# Patient Record
Sex: Male | Born: 1990 | Race: Black or African American | Hispanic: No | Marital: Single | Smoking: Former smoker
Health system: Southern US, Community
[De-identification: ages and names within clinical notes are randomized; demographics above are authoritative.]

---

## 2010-09-25 ENCOUNTER — Emergency Department (INDEPENDENT_AMBULATORY_CARE_PROVIDER_SITE_OTHER): Payer: Self-pay

## 2010-09-25 ENCOUNTER — Encounter: Payer: Self-pay | Admitting: Student

## 2010-09-25 ENCOUNTER — Emergency Department (HOSPITAL_BASED_OUTPATIENT_CLINIC_OR_DEPARTMENT_OTHER)
Admission: EM | Admit: 2010-09-25 | Discharge: 2010-09-25 | Disposition: A | Discharge status: Home / Self Care | Payer: Self-pay | Attending: Emergency Medicine | Admitting: Emergency Medicine

## 2010-09-25 DIAGNOSIS — R0789 Other chest pain: Secondary | ICD-10-CM | POA: Insufficient documentation

## 2010-09-25 DIAGNOSIS — J45909 Unspecified asthma, uncomplicated: Secondary | ICD-10-CM | POA: Insufficient documentation

## 2010-09-25 DIAGNOSIS — F17200 Nicotine dependence, unspecified, uncomplicated: Secondary | ICD-10-CM | POA: Insufficient documentation

## 2010-09-25 DIAGNOSIS — R079 Chest pain, unspecified: Secondary | ICD-10-CM

## 2010-09-25 MED ORDER — IBUPROFEN 800 MG PO TABS
800.0000 mg | ORAL_TABLET | Freq: Once | ORAL | Status: AC
  Administered 2010-09-25: 800 mg via ORAL
  Filled 2010-09-25: qty 1

## 2010-09-25 NOTE — ED Provider Notes (Signed)
History     Chief Complaint  Patient presents with  . Rib Injury    pt in with c/o left axiallary and rib cage pain.   The history is provided by the patient.  Patient states he was walking to store yesterday and had onset of left rib pain.  Pain increases with trunk movement.  3/10 lying still increases to10/10 with movement.  Hurts with deep inspiration.  No dyspnea, hematuria, or fever.  No similar symptoms in past.  No trauma.  Past Medical History  Diagnosis Date  . Asthma     No past surgical history on file.  No family history on file.  History  Substance Use Topics  . Smoking status: Current Some Day Smoker  . Smokeless tobacco: Not on file  . Alcohol Use: No      Review of Systems  All other systems reviewed and are negative.    Physical Exam  BP 130/75  Pulse 74  Temp(Src) 98.2 F (36.8 C) (Oral)  Resp 20  Wt 240 lb (108.863 kg)  SpO2 100%  Physical Exam  Nursing note and vitals reviewed. Constitutional: He appears well-developed and well-nourished.  HENT:  Head: Normocephalic and atraumatic.  Eyes: Pupils are equal, round, and reactive to light.  Neck: Normal range of motion. Neck supple.  Cardiovascular: Normal rate.   Pulmonary/Chest: Effort normal and breath sounds normal.  Abdominal: Soft. There is no CVA tenderness.  Musculoskeletal:       Arms:   ED Course  Procedures  MDM Dg Ribs Unilateral W/chest Left  09/25/2010  *RADIOLOGY REPORT*  Clinical Data: Left lower posterior rib pain  LEFT RIBS AND CHEST - 3+ VIEW  Comparison: None.  Findings: Lungs are clear. No pleural effusion or pneumothorax.  Cardiomediastinal silhouette is within normal limits.  Visualized osseous structures are within normal limits.  No left rib fracture is seen.  IMPRESSION: No evidence of acute cardiopulmonary disease.  No left rib fracture is seen.  Original Report Authenticated By: Charline Bills, M.D.       Hilario Quarry, MD 09/25/10 [phone]

## 2010-09-25 NOTE — ED Notes (Signed)
pt in with c/o left axillary and rib cage pain. Started last PM while ambulating to store. Reports lower back pain 3 days prior to onset after vigorous activity. Denies N V D CP LOC SOB reports pain with deep inspiration.

## 2014-06-23 ENCOUNTER — Emergency Department (HOSPITAL_BASED_OUTPATIENT_CLINIC_OR_DEPARTMENT_OTHER)
Admission: EM | Admit: 2014-06-23 | Discharge: 2014-06-23 | Disposition: A | Discharge status: Home / Self Care | Payer: Self-pay | Attending: Emergency Medicine | Admitting: Emergency Medicine

## 2014-06-23 ENCOUNTER — Encounter (HOSPITAL_BASED_OUTPATIENT_CLINIC_OR_DEPARTMENT_OTHER): Payer: Self-pay | Admitting: Emergency Medicine

## 2014-06-23 DIAGNOSIS — R5383 Other fatigue: Secondary | ICD-10-CM | POA: Insufficient documentation

## 2014-06-23 DIAGNOSIS — Z7951 Long term (current) use of inhaled steroids: Secondary | ICD-10-CM | POA: Insufficient documentation

## 2014-06-23 DIAGNOSIS — J45909 Unspecified asthma, uncomplicated: Secondary | ICD-10-CM | POA: Insufficient documentation

## 2014-06-23 DIAGNOSIS — Z720 Tobacco use: Secondary | ICD-10-CM | POA: Insufficient documentation

## 2014-06-23 LAB — BASIC METABOLIC PANEL
Anion gap: 6 (ref 5–15)
BUN: 14 mg/dL (ref 6–23)
CHLORIDE: 99 mmol/L (ref 96–112)
CO2: 30 mmol/L (ref 19–32)
Calcium: 9.5 mg/dL (ref 8.4–10.5)
Creatinine, Ser: 1.33 mg/dL (ref 0.50–1.35)
GFR calc Af Amer: 85 mL/min — ABNORMAL LOW (ref >90)
GFR calc non Af Amer: 73 mL/min — ABNORMAL LOW (ref >90)
GLUCOSE: 98 mg/dL (ref 70–99)
POTASSIUM: 3.9 mmol/L (ref 3.5–5.1)
SODIUM: 135 mmol/L (ref 135–145)

## 2014-06-23 LAB — CBC WITH DIFFERENTIAL/PLATELET
BASOS ABS: 0 10*3/uL (ref 0.0–0.1)
Basophils Relative: 1 % (ref 0–1)
Eosinophils Absolute: 0.3 10*3/uL (ref 0.0–0.7)
Eosinophils Relative: 8 % — ABNORMAL HIGH (ref 0–5)
HEMATOCRIT: 46.6 % (ref 39.0–52.0)
Hemoglobin: 16 g/dL (ref 13.0–17.0)
LYMPHS PCT: 48 % — AB (ref 12–46)
Lymphs Abs: 2.2 10*3/uL (ref 0.7–4.0)
MCH: 29.9 pg (ref 26.0–34.0)
MCHC: 34.3 g/dL (ref 30.0–36.0)
MCV: 87.1 fL (ref 78.0–100.0)
MONO ABS: 0.5 10*3/uL (ref 0.1–1.0)
Monocytes Relative: 11 % (ref 3–12)
Neutro Abs: 1.4 10*3/uL — ABNORMAL LOW (ref 1.7–7.7)
Neutrophils Relative %: 32 % — ABNORMAL LOW (ref 43–77)
Platelets: 175 10*3/uL (ref 150–400)
RBC: 5.35 MIL/uL (ref 4.22–5.81)
RDW: 12.9 % (ref 11.5–15.5)
WBC: 4.5 10*3/uL (ref 4.0–10.5)

## 2014-06-23 NOTE — ED Provider Notes (Signed)
CSN: [id number]     Arrival date & time 06/23/14  1638 History   First MD Initiated Contact with Patient 06/23/14 1647     Chief Complaint  Patient presents with  . Fatigue     (Consider location/radiation/quality/duration/timing/severity/associated sxs/prior Treatment) HPI  24 year old male with a past medical history of asthma presents with fatigue for the past 4-5 days. He states he had about 30 seconds of squeezing chest pain before this all started. Denies a shortness of breath. Since then he has felt more tired than normal. Denies any exertional chest pain or shortness of breath. No neck swelling or neck pain. No abdominal pain. No vomiting or diarrhea, as well as no black or tarry stools. No blood in his stools. Has checked his blood pressure multiple times with his mother's blood pressure cuff and noticed he has been hypertensive intermittently. At one time his blood pressure was 190 systolic, other times it has been 140 or 160. Denies any pain anywhere, just feels tired.  Past Medical History  Diagnosis Date  . Asthma    History reviewed. No pertinent past surgical history. No family history on file. History  Substance Use Topics  . Smoking status: Current Some Day Smoker  . Smokeless tobacco: Not on file  . Alcohol Use: No    Review of Systems  Constitutional: Positive for fatigue. Negative for fever.  HENT: Negative for sore throat.   Respiratory: Negative for cough and shortness of breath.   Cardiovascular: Positive for chest pain (4 days ago, none since). Negative for leg swelling.  Gastrointestinal: Negative for nausea, vomiting, abdominal pain and blood in stool.  Genitourinary: Negative for dysuria.  Neurological: Negative for dizziness, weakness and headaches.  All other systems reviewed and are negative.     Allergies  Review of patient's allergies indicates no known allergies.  Home Medications   Prior to Admission medications   Medication Sig Start  Date End Date Taking? Authorizing Provider  fluticasone-salmeterol (ADVAIR HFA) 115-21 MCG/ACT inhaler Inhale 2 puffs into the lungs 2 (two) times daily.      Historical Provider, MD   BP 135/88 mmHg  Pulse 69  Temp(Src) 98.4 F (36.9 C) (Oral)  Resp 16  Ht 5\' 11"  (1.803 m)  Wt 230 lb (104.327 kg)  BMI 32.09 kg/m2  SpO2 100% Physical Exam  Constitutional: He is oriented to person, place, and time. He appears well-developed and well-nourished.  HENT:  Head: Normocephalic and atraumatic.  Right Ear: External ear normal.  Left Ear: External ear normal.  Nose: Nose normal.  Eyes: Right eye exhibits no discharge. Left eye exhibits no discharge.  Neck: Neck supple. No thyromegaly present.  Cardiovascular: Normal rate, regular rhythm, normal heart sounds and intact distal pulses.   No murmur heard. Pulmonary/Chest: Effort normal and breath sounds normal.  Abdominal: Soft. There is no tenderness.  Musculoskeletal: He exhibits no edema.  Neurological: He is alert and oriented to person, place, and time.  Skin: Skin is warm and dry.  Nursing note and vitals reviewed.   ED Course  Procedures (including critical care time) Labs Review Labs Reviewed  CBC WITH DIFFERENTIAL/PLATELET - Abnormal; Notable for the following:    Neutrophils Relative % 32 (*)    Neutro Abs 1.4 (*)    Lymphocytes Relative 48 (*)    Eosinophils Relative 8 (*)    All other components within normal limits  BASIC METABOLIC PANEL - Abnormal; Notable for the following:    GFR calc non Af Denyse DagoAmer  73 (*)    GFR calc Af Amer 85 (*)    All other components within normal limits    Imaging Review No results found.   EKG Interpretation   Date/Time:  Thursday June 23 2014 17:22:05 EDT Ventricular Rate:  64 PR Interval:  206 QRS Duration: 96 QT Interval:  374 QTC Calculation: 385 R Axis:   94 Text Interpretation:  Normal sinus rhythm Rightward axis Borderline ECG No  old tracing to compare Confirmed by Ayva Veilleux   MD, Keary Waterson ([id number]) on  06/23/2014 5:34:20 PM      MDM   Final diagnoses:  Other fatigue    Patient with nonspecific fatigue for the past 4 days. Did have one episode of chest pain is very brief that sounds atypical. He is low risk for ACS and has a benign ECG. Symptoms since then and not feel troponin testing is necessary. No anemia or other acute abdominal is on lab work. Normal exam. Stable for discharge, will recommend PCP follow-up. No hypertension here.    Pricilla Loveless, MD 06/23/14 [phone]

## 2014-06-23 NOTE — ED Notes (Signed)
Fatigue x4 days.  Had one instance of cp 4 days ago that lasted 30 seconds.  Denies any other sx.

## 2014-06-23 NOTE — Discharge Instructions (Signed)

## 2014-06-23 NOTE — ED Notes (Signed)
MD at bedside. 

## 2014-12-20 ENCOUNTER — Encounter (HOSPITAL_BASED_OUTPATIENT_CLINIC_OR_DEPARTMENT_OTHER): Payer: Self-pay | Admitting: *Deleted

## 2014-12-20 ENCOUNTER — Emergency Department (HOSPITAL_BASED_OUTPATIENT_CLINIC_OR_DEPARTMENT_OTHER): Payer: Self-pay

## 2014-12-20 ENCOUNTER — Emergency Department (HOSPITAL_BASED_OUTPATIENT_CLINIC_OR_DEPARTMENT_OTHER)
Admission: EM | Admit: 2014-12-20 | Discharge: 2014-12-20 | Disposition: A | Discharge status: Home / Self Care | Payer: Self-pay | Attending: Emergency Medicine | Admitting: Emergency Medicine

## 2014-12-20 DIAGNOSIS — J45909 Unspecified asthma, uncomplicated: Secondary | ICD-10-CM | POA: Insufficient documentation

## 2014-12-20 DIAGNOSIS — Z87891 Personal history of nicotine dependence: Secondary | ICD-10-CM | POA: Insufficient documentation

## 2014-12-20 DIAGNOSIS — J159 Unspecified bacterial pneumonia: Secondary | ICD-10-CM | POA: Insufficient documentation

## 2014-12-20 DIAGNOSIS — J189 Pneumonia, unspecified organism: Secondary | ICD-10-CM

## 2014-12-20 DIAGNOSIS — R42 Dizziness and giddiness: Secondary | ICD-10-CM | POA: Insufficient documentation

## 2014-12-20 DIAGNOSIS — Z7951 Long term (current) use of inhaled steroids: Secondary | ICD-10-CM | POA: Insufficient documentation

## 2014-12-20 DIAGNOSIS — R531 Weakness: Secondary | ICD-10-CM | POA: Insufficient documentation

## 2014-12-20 LAB — BASIC METABOLIC PANEL
ANION GAP: 8 (ref 5–15)
BUN: 10 mg/dL (ref 6–20)
CO2: 28 mmol/L (ref 22–32)
Calcium: 9.2 mg/dL (ref 8.9–10.3)
Chloride: 98 mmol/L — ABNORMAL LOW (ref 101–111)
Creatinine, Ser: 1.35 mg/dL — ABNORMAL HIGH (ref 0.61–1.24)
GFR calc Af Amer: >60 mL/min (ref >60)
Glucose, Bld: 130 mg/dL — ABNORMAL HIGH (ref 65–99)
POTASSIUM: 3.8 mmol/L (ref 3.5–5.1)
SODIUM: 134 mmol/L — AB (ref 135–145)

## 2014-12-20 LAB — CBC WITH DIFFERENTIAL/PLATELET
BASOS ABS: 0 10*3/uL (ref 0.0–0.1)
Basophils Relative: 1 %
EOS ABS: 0 10*3/uL (ref 0.0–0.7)
EOS PCT: 0 %
HCT: 48.6 % (ref 39.0–52.0)
Hemoglobin: 16.5 g/dL (ref 13.0–17.0)
Lymphocytes Relative: 17 %
Lymphs Abs: 1.4 10*3/uL (ref 0.7–4.0)
MCH: 29.4 pg (ref 26.0–34.0)
MCHC: 34 g/dL (ref 30.0–36.0)
MCV: 86.6 fL (ref 78.0–100.0)
Monocytes Absolute: 1 10*3/uL (ref 0.1–1.0)
Monocytes Relative: 12 %
Neutro Abs: 5.9 10*3/uL (ref 1.7–7.7)
Neutrophils Relative %: 70 %
PLATELETS: 179 10*3/uL (ref 150–400)
RBC: 5.61 MIL/uL (ref 4.22–5.81)
RDW: 13.2 % (ref 11.5–15.5)
WBC: 8.4 10*3/uL (ref 4.0–10.5)

## 2014-12-20 LAB — URINALYSIS, ROUTINE W REFLEX MICROSCOPIC
BILIRUBIN URINE: NEGATIVE
Glucose, UA: NEGATIVE mg/dL
HGB URINE DIPSTICK: NEGATIVE
Ketones, ur: NEGATIVE mg/dL
Leukocytes, UA: NEGATIVE
Nitrite: NEGATIVE
PROTEIN: NEGATIVE mg/dL
Specific Gravity, Urine: 1.017 (ref 1.005–1.030)
pH: 7.5 (ref 5.0–8.0)

## 2014-12-20 MED ORDER — GUAIFENESIN ER 1200 MG PO TB12: 1.0000 | ORAL_TABLET | Freq: Two times a day (BID) | ORAL | Status: AC

## 2014-12-20 MED ORDER — AZITHROMYCIN 250 MG PO TABS: ORAL_TABLET | ORAL | Status: AC

## 2014-12-20 MED ORDER — ACETAMINOPHEN 500 MG PO TABS
1000.0000 mg | ORAL_TABLET | Freq: Once | ORAL | Status: AC
  Administered 2014-12-20: 1000 mg via ORAL
  Filled 2014-12-20: qty 2

## 2014-12-20 MED ORDER — CEFTRIAXONE SODIUM 1 G IJ SOLR
INTRAMUSCULAR | Status: AC
  Filled 2014-12-20: qty 10

## 2014-12-20 MED ORDER — SODIUM CHLORIDE 0.9 % IV BOLUS (SEPSIS)
1000.0000 mL | Freq: Once | INTRAVENOUS | Status: AC
  Administered 2014-12-20: 1000 mL via INTRAVENOUS

## 2014-12-20 MED ORDER — AEROCHAMBER PLUS FLO-VU MEDIUM MISC
1.0000 | Freq: Once | Status: AC
  Administered 2014-12-20: 1
  Filled 2014-12-20: qty 1

## 2014-12-20 MED ORDER — DEXTROSE 5 % IV SOLN
1.0000 g | Freq: Once | INTRAVENOUS | Status: AC
  Administered 2014-12-20: 1 g via INTRAVENOUS

## 2014-12-20 MED ORDER — IPRATROPIUM-ALBUTEROL 0.5-2.5 (3) MG/3ML IN SOLN
3.0000 mL | Freq: Once | RESPIRATORY_TRACT | Status: DC
  Filled 2014-12-20: qty 3

## 2014-12-20 MED ORDER — ALBUTEROL SULFATE HFA 108 (90 BASE) MCG/ACT IN AERS
2.0000 | INHALATION_SPRAY | RESPIRATORY_TRACT | Status: DC | PRN
  Administered 2014-12-20: 2 via RESPIRATORY_TRACT
  Filled 2014-12-20: qty 6.7

## 2014-12-20 NOTE — ED Provider Notes (Signed)
CSN: [id number]     Arrival date & time 12/20/14  1631 History   First MD Initiated Contact with Patient 12/20/14 1720     Chief Complaint  Patient presents with  . Fever     (Consider location/radiation/quality/duration/timing/severity/associated sxs/prior Treatment) HPI Patient presents to the emergency department with fever that started yesterday.  The patient states that he has had some dizziness and weakness since the fever started, but no other symptoms.  He denies cough, runny nose, sore throat, headache, blurred vision, back pain, dysuria, abdominal pain, chest pain, shortness of breath, near syncope or syncope.  The patient states that he did not take any medications prior to arrival.  States the disease makes condition better or worse Past Medical History  Diagnosis Date  . Asthma    History reviewed. No pertinent past surgical history. No family history on file. Social History  Substance Use Topics  . Smoking status: Former Games developer  . Smokeless tobacco: None  . Alcohol Use: No    Review of Systems All other systems negative except as documented in the HPI. All pertinent positives and negatives as reviewed in the HPI.   Allergies  Review of patient's allergies indicates no known allergies.  Home Medications   Prior to Admission medications   Medication Sig Start Date End Date Taking? Authorizing Provider  fluticasone-salmeterol (ADVAIR HFA) 115-21 MCG/ACT inhaler Inhale 2 puffs into the lungs 2 (two) times daily.      Historical Provider, MD   BP 136/77 mmHg  Pulse 99  Temp(Src) 98.8 F (37.1 C) (Oral)  Resp 20  Ht  (1.803 m)  Wt 220 lb (99.791 kg)  BMI 30.70 kg/m2  SpO2 97% Physical Exam  Constitutional: He is oriented to person, place, and time. He appears well-developed and well-nourished. No distress.  HENT:  Head: Normocephalic and atraumatic.  Mouth/Throat: Oropharynx is clear and moist.  Eyes: Pupils are equal, round, and reactive to light.   Neck: Normal range of motion. Neck supple.  Cardiovascular: Normal rate, regular rhythm and normal heart sounds.  Exam reveals no gallop and no friction rub.   No murmur heard. Pulmonary/Chest: Effort normal and breath sounds normal. No respiratory distress.  Neurological: He is alert and oriented to person, place, and time. He exhibits normal muscle tone. Coordination normal.  Skin: Skin is warm and dry. No rash noted. No erythema.  Nursing note and vitals reviewed.   ED Course  Procedures (including critical care time) Labs Review Labs Reviewed  URINALYSIS, ROUTINE W REFLEX MICROSCOPIC (NOT AT [facility]) - Abnormal; Notable for the following:    Urobilinogen, UA >8.0 (*)    All other components within normal limits  BASIC METABOLIC PANEL - Abnormal; Notable for the following:    Sodium 134 (*)    Chloride 98 (*)    Glucose, Bld 130 (*)    Creatinine, Ser 1.35 (*)    All other components within normal limits  CBC WITH DIFFERENTIAL/PLATELET    Imaging Review Dg Chest 2 View  12/20/2014   CLINICAL DATA:  Fever and dizziness for 1 day  EXAM: CHEST - 2 VIEW  COMPARISON:  09/25/2010  FINDINGS: Cardiac shadow is within normal limits. The medial aspect of the right lung base there is evidence of right middle lobe pneumonia. No associated effusion is seen. No other infiltrates are noted. No bony abnormality is seen.  IMPRESSION: Right middle lobe pneumonia.   Electronically Signed   By: Alcide Clever M.D.   On: 12/20/2014  18:34   I have personally reviewed and evaluated these images and lab results as part of my medical decision-making.  Patient be treated for community-acquired pneumonia based on his chest x-ray.  Patient is advised to rest as much as possible.  Increase his fluid intake.  Told to return here for any worsening in his condition   Charlestine Night, PA-C 12/20/14 2025  Geoffery Lyons, MD 12/20/14 2138

## 2014-12-20 NOTE — ED Notes (Signed)
Pt discharged at 2040, was not taken out of computer

## 2014-12-20 NOTE — Discharge Instructions (Signed)
Return here as needed for any worsening in your condition increase her fluid intake and rest as much as possible

## 2014-12-20 NOTE — ED Notes (Signed)
Fever, chills and headache. Last Tylenol was at 7am.

## 2014-12-20 NOTE — Progress Notes (Signed)
Patient refused Duoneb nebulizer treatment.  Patient states that he is breathing fine.

## 2015-01-16 ENCOUNTER — Telehealth (HOSPITAL_COMMUNITY): Payer: Self-pay

## 2015-01-16 NOTE — Telephone Encounter (Signed)
Pt thought he had STD testing done.  ID verified x 2.  Pt informed no STD testing done.

## 2015-02-04 ENCOUNTER — Emergency Department (HOSPITAL_BASED_OUTPATIENT_CLINIC_OR_DEPARTMENT_OTHER)
Admission: EM | Admit: 2015-02-04 | Discharge: 2015-02-05 | Disposition: A | Discharge status: Home / Self Care | Payer: Self-pay | Attending: Emergency Medicine | Admitting: Emergency Medicine

## 2015-02-04 DIAGNOSIS — J45909 Unspecified asthma, uncomplicated: Secondary | ICD-10-CM | POA: Insufficient documentation

## 2015-02-04 DIAGNOSIS — J020 Streptococcal pharyngitis: Secondary | ICD-10-CM | POA: Insufficient documentation

## 2015-02-04 DIAGNOSIS — Z7951 Long term (current) use of inhaled steroids: Secondary | ICD-10-CM | POA: Insufficient documentation

## 2015-02-04 DIAGNOSIS — Z87891 Personal history of nicotine dependence: Secondary | ICD-10-CM | POA: Insufficient documentation

## 2015-02-04 DIAGNOSIS — Z79899 Other long term (current) drug therapy: Secondary | ICD-10-CM | POA: Insufficient documentation

## 2015-02-04 LAB — RAPID STREP SCREEN (MED CTR MEBANE ONLY): STREPTOCOCCUS, GROUP A SCREEN (DIRECT): POSITIVE — AB

## 2015-02-04 MED ORDER — ACETAMINOPHEN 325 MG PO TABS
650.0000 mg | ORAL_TABLET | Freq: Once | ORAL | Status: AC
  Administered 2015-02-04: 650 mg via ORAL
  Filled 2015-02-04: qty 2

## 2015-02-04 NOTE — ED Notes (Signed)
Presents with 3 days of sore throat, fever of 103 and pain with swallowing and talking. Throat red.

## 2015-02-05 MED ORDER — DEXAMETHASONE SODIUM PHOSPHATE 10 MG/ML IJ SOLN
10.0000 mg | Freq: Once | INTRAMUSCULAR | Status: AC
  Administered 2015-02-05: 10 mg via INTRAMUSCULAR
  Filled 2015-02-05: qty 1

## 2015-02-05 MED ORDER — IBUPROFEN 400 MG PO TABS
400.0000 mg | ORAL_TABLET | Freq: Once | ORAL | Status: AC
  Administered 2015-02-05: 400 mg via ORAL
  Filled 2015-02-05: qty 1

## 2015-02-05 MED ORDER — HYDROCODONE-ACETAMINOPHEN 7.5-325 MG/15ML PO SOLN: ORAL | Status: AC

## 2015-02-05 MED ORDER — PENICILLIN G BENZATHINE 1200000 UNIT/2ML IM SUSP
1.2000 10*6.[IU] | Freq: Once | INTRAMUSCULAR | Status: AC
  Administered 2015-02-05: 1.2 10*6.[IU] via INTRAMUSCULAR
  Filled 2015-02-05: qty 2

## 2015-02-05 MED ORDER — OXYCODONE-ACETAMINOPHEN 5-325 MG PO TABS
1.0000 | ORAL_TABLET | Freq: Once | ORAL | Status: AC
  Administered 2015-02-05: 1 via ORAL
  Filled 2015-02-05: qty 1

## 2015-02-05 NOTE — ED Provider Notes (Signed)
CSN: [id number]     Arrival date & time 02/04/15  2324 History   First MD Initiated Contact with Patient 02/05/15 0109     Chief Complaint  Patient presents with  . Sore Throat     (Consider location/radiation/quality/duration/timing/severity/associated sxs/prior Treatment) HPI  This is a 24 year old male with a three-day history of sore throat and fevers high as 103.5. He rates his throat pain as moderate to severe, worse with swallowing. He has had general malaise but no shortness of breath, dysphonia, rash or abdominal pain. He was given ibuprofen and Percocet earlier with some relief.  Past Medical History  Diagnosis Date  . Asthma    No past surgical history on file. No family history on file. Social History  Substance Use Topics  . Smoking status: Former Games developermoker  . Smokeless tobacco: Not on file  . Alcohol Use: No    Review of Systems  All other systems reviewed and are negative.   Allergies  Review of patient's allergies indicates no known allergies.  Home Medications   Prior to Admission medications   Medication Sig Start Date End Date Taking? Authorizing Provider  azithromycin (ZITHROMAX) 250 MG tablet 2  By mouth day one, then 1 by mouth daily days 2 through 5 12/20/14   Charlestine Nighthristopher Lawyer, PA-C  fluticasone-salmeterol (ADVAIR HFA) 115-21 MCG/ACT inhaler Inhale 2 puffs into the lungs 2 (two) times daily.      Historical Provider, MD  Guaifenesin 1200 MG TB12 Take 1 tablet (1,200 mg total) by mouth 2 (two) times daily. 12/20/14   Christopher Lawyer, PA-C   BP 126/83 mmHg  Pulse 115  Temp(Src) 101.6 F (38.7 C) (Oral)  Resp 16  Ht 5\' 11"  (1.803 m)  Wt 220 lb (99.791 kg)  BMI 30.70 kg/m2  SpO2 96%   Physical Exam  General: Well-developed, well-nourished male in no acute distress; appearance consistent with age of record HENT: normocephalic; atraumatic; pharyngeal erythema without exudate Eyes: pupils equal, round and reactive to light; extraocular muscles  intact Neck: supple; no lymphadenopathy Heart: regular rate and rhythm Lungs: clear to auscultation bilaterally Abdomen: soft; nondistended; nontender; no masses or hepatosplenomegaly; bowel sounds present Extremities: No deformity; full range of motion; pulses normal Neurologic: Awake, alert and oriented; motor function intact in all extremities and symmetric; no facial droop Skin: Warm and dry Psychiatric: Flat affect    ED Course  Procedures (including critical care time)   MDM   Nursing notes and vitals signs, including pulse oximetry, reviewed.  Summary of this visit's results, reviewed by myself:  Labs:  Results for orders placed or performed during the hospital encounter of 02/04/15 (from the past 24 hour(s))  Rapid strep screen     Status: Abnormal   Collection Time: 02/04/15 11:30 PM  Result Value Ref Range   Streptococcus, Group A Screen (Direct) POSITIVE (A) NEGATIVE       Paula LibraJohn Caasi Giglia, MD 02/05/15 [id number]

## 2015-03-14 ENCOUNTER — Emergency Department (HOSPITAL_BASED_OUTPATIENT_CLINIC_OR_DEPARTMENT_OTHER)
Admission: EM | Admit: 2015-03-14 | Discharge: 2015-03-14 | Disposition: A | Discharge status: Home / Self Care | Payer: Self-pay | Attending: Emergency Medicine | Admitting: Emergency Medicine

## 2015-03-14 ENCOUNTER — Emergency Department (HOSPITAL_BASED_OUTPATIENT_CLINIC_OR_DEPARTMENT_OTHER): Payer: Self-pay

## 2015-03-14 ENCOUNTER — Encounter (HOSPITAL_BASED_OUTPATIENT_CLINIC_OR_DEPARTMENT_OTHER): Payer: Self-pay | Admitting: *Deleted

## 2015-03-14 DIAGNOSIS — Y9289 Other specified places as the place of occurrence of the external cause: Secondary | ICD-10-CM | POA: Insufficient documentation

## 2015-03-14 DIAGNOSIS — Z7951 Long term (current) use of inhaled steroids: Secondary | ICD-10-CM | POA: Insufficient documentation

## 2015-03-14 DIAGNOSIS — G8929 Other chronic pain: Secondary | ICD-10-CM | POA: Insufficient documentation

## 2015-03-14 DIAGNOSIS — M79662 Pain in left lower leg: Secondary | ICD-10-CM

## 2015-03-14 DIAGNOSIS — J45909 Unspecified asthma, uncomplicated: Secondary | ICD-10-CM | POA: Insufficient documentation

## 2015-03-14 DIAGNOSIS — Y9389 Activity, other specified: Secondary | ICD-10-CM | POA: Insufficient documentation

## 2015-03-14 DIAGNOSIS — S8992XA Unspecified injury of left lower leg, initial encounter: Secondary | ICD-10-CM | POA: Insufficient documentation

## 2015-03-14 DIAGNOSIS — Y998 Other external cause status: Secondary | ICD-10-CM | POA: Insufficient documentation

## 2015-03-14 DIAGNOSIS — W1839XA Other fall on same level, initial encounter: Secondary | ICD-10-CM | POA: Insufficient documentation

## 2015-03-14 DIAGNOSIS — Z79899 Other long term (current) drug therapy: Secondary | ICD-10-CM | POA: Insufficient documentation

## 2015-03-14 DIAGNOSIS — Z87891 Personal history of nicotine dependence: Secondary | ICD-10-CM | POA: Insufficient documentation

## 2015-03-14 MED ORDER — NAPROXEN 500 MG PO TABS: 500.0000 mg | ORAL_TABLET | Freq: Two times a day (BID) | ORAL | Status: AC

## 2015-03-14 NOTE — Discharge Instructions (Signed)
Possible Patellar Tendon Tear or Disruption With Rehab A patellar tendon tear or disruption is a complete tear of the tendon below the kneecap. The patellar tendon attaches the thigh muscle (quadriceps) to the shinbone (tibia). These muscles are responsible for bending the knee and flexing the hip. A tear in the patellar tendon results in a disability to perform these actions. SYMPTOMS   A "pop" or tear felt in the knee or under the kneecap, at the time of injury.  Pain, tenderness, swelling, warmth, or redness over and around the patellar tendon.  Pain that gets worse when trying to forcefully straighten the knee or bend the knee.  Inability to straighten the knee when seated.  Crackling sound (crepitation) when the tendon is moved or touched.  Bruising (contusion) around the knee within 48 hours of injury.  Loss of firm fullness when pushing on the area where the tendon ruptured (a defect between the ends of the tendon where they separated from each other). CAUSES  The patellar tendon tears when a force is placed on it that is greater than it can handle. Common causes of injury include:  A stressful incident, such as with jumping, hurdling, or starting a sprint.  Direct hit (trauma) to the knee. RISK INCREASES WITH:  Sports that require sudden, explosive muscle contraction, such as those involving jumping or quick starts.  Running or contact sports.  Poor strength and flexibility.  Previous patellar tendon injury.  Untreated patellar tendinitis.  Corticosteroid injection into the patellar tendon. (Corticosteroid injections weaken tendons.) PREVENTION  Warm up and stretch properly before activity.  Allow for adequate recovery between workouts.  Maintain physical fitness:  Strength, flexibility, and endurance.  Cardiovascular fitness.  Protect the knee with taping, protective strapping, or elastic compression bandage during activity. PROGNOSIS  If treated properly,  patellar tendon tears usually heal, with a return to sports within 6 to 9 months after injury.  RELATED COMPLICATIONS   Weakness of the thigh (quadriceps) muscles, especially if the tear is left untreated.  Re-rupture of the tendon after treatment.  Prolonged disability.  Risks of surgery: infection, injury to nerves (numbness, weakness, or paralysis), bleeding, knee stiffness, knee weakness, pain when sitting for long periods, pain when getting up from a seated position and when kneeling or squatting, pain going up or down stairs or hills, and knee giving way or buckling. TREATMENT  Treatment first involves resting from any activities that aggravate the symptoms. The use of ice and medicine will help reduce pain and inflammation. Applying a compression bandage and elevating the knee above the level of the heart will also help reduce inflammation. Definitive treatment for patellar tendon tears is surgery, because contraction of the quadriceps tendon prevents healing of the tendon. Surgery often involves using stitches (sutures) to sew the ends of the tendon back together. Surgery is followed by restraint of the knee, to allow for healing. After restraint, it is important to perform strengthening and stretching exercises to help regain strength and a full range of motion. These exercises may be completed at home or with a therapist.  MEDICATION   If pain medicine is needed, nonsteroidal anti-inflammatory medicines (aspirin and ibuprofen), or other minor pain relievers (acetaminophen), are often advised.  Do not take pain medicine for 7 days before surgery.  Prescription pain relievers may be given, if your caregiver thinks they are needed. Use only as directed and only as much as you need. COLD THERAPY  Cold treatment (icing) should be applied for 10 to  15 minutes every 2 to 3 hours for inflammation and pain, and immediately after activity that aggravates your symptoms. Use ice packs or an ice  massage. SEEK MEDICAL CARE IF:  Pain increases, despite treatment.  Cast discomfort develops.  Any of the following occur after surgery: signs of infection, including fever, increased pain, swelling, redness, drainage of fluids, or bleeding in the affected area.  New, unexplained symptoms develop. (Drugs used in treatment may produce side effects.) EXERCISES RANGE OF MOTION (ROM) AND STRETCHING EXERCISES - Patellar Tendon Tear/Disruption These exercises may help you when beginning to rehabilitate your injury. Your symptoms may resolve with or without further involvement from your physician, physical therapist or athletic trainer. While completing these exercises, remember:   Restoring tissue flexibility helps normal motion to return to the joints. This allows healthier, less painful movement and activity.  An effective stretch should be held for at least 30 seconds.  A stretch should never be painful. You should only feel a gentle lengthening or release in the stretched tissue. RANGE OF MOTION - Knee Flexion and Extension, Active-Assisted  Sit on the edge of a table or chair with your thighs firmly supported. It may be helpful to place a folded towel under the end of your right / left thigh.  Flexion (bending): Place the ankle of your healthy leg on top of the other ankle. Use your healthy leg to gently bend your right / left knee until you feel a mild tension across the top of your knee.  Hold for __________ seconds.  Extension (straightening): Switch your ankles so your right / left leg is on top. Use your healthy leg to straighten your right / left knee until you feel a mild tension on the backside of your knee.  Hold for __________ seconds. Repeat __________ times. Complete this exercise __________ times per day. RANGE OF MOTION - Knee Flexion, Active  Lie on your back with both knees straight. (If this causes back discomfort, bend your healthy knee, placing your foot flat on the  floor.)  Slowly slide your heel back toward your buttocks until you feel a gentle stretch in the front of your knee or thigh.  Hold for __________ seconds. Slowly slide your heel back to the starting position. Repeat __________ times. Complete this exercise __________ times per day.  STRENGTHENING EXERCISES - Patellar Tendon Tear/Disruption  These exercises may help you when beginning to rehabilitate your injury. They may resolve your symptoms with or without further involvement from your physician, physical therapist or athletic trainer. While completing these exercises, remember:   Muscles can gain both the endurance and the strength needed for everyday activities through controlled exercises.  Complete these exercises as instructed by your physician, physical therapist or athletic trainer. Increase the resistance and repetitions only as guided by your caregiver. STRENGTH - Quadriceps, Isometrics  Lie on your back with your right / left leg extended and your opposite knee bent.  Gradually tense the muscles in the front of your right / left thigh. You should see either your kneecap slide up toward your hip or increased dimpling just above the knee. This motion will push the back of the knee down toward the floor, mat, or bed on which you are lying.  Hold the muscle as tight as you can without increasing your pain for __________ seconds.  Relax the muscles slowly and completely between each repetition. Repeat __________ times. Complete this exercise __________ times per day.  STRENGTH - Quadriceps, Straight Leg Raises Quality  counts! Watch for signs that the quadriceps muscle is working, to be sure you are strengthening the correct muscles and not "cheating" by substituting with healthier muscles.  Lay on your back with your right / left leg extended and your opposite knee bent.  Tense the muscles in the front of your right / left thigh. You should see either your kneecap slide up or  increased dimpling just above the knee. Your thigh may even shake a bit.  Tighten these muscles even more and raise your leg 4 to 6 inches off the floor. Hold for __________ seconds.  Keeping these muscles tense, lower your leg.  Relax the muscles slowly and completely between each repetition. Repeat __________ times. Complete this exercise __________ times per day.  STRENGTH - Hip Abductors, Straight Leg Raises  Be aware of your form throughout the entire exercise, so that you exercise the correct muscles Poor form means that you are not strengthening the correct muscles.  Lie on your side so that your head, shoulders, knee and hip line up. You may bend your lower knee to help maintain your balance. Your right / left leg should be on top.  Roll your hips slightly forward, so that your hips are stacked directly over each other and your right / left knee is facing forward.  Lift your top leg up 4-6 inches, leading with your heel. Be sure that your foot does not drift forward or that your knee does not roll toward the ceiling.  Hold this position for __________ seconds. You should feel the muscles in your outer hip lifting. (You may not notice this until your leg begins to tire.)  Slowly lower your leg to the starting position. Allow the muscles to fully relax before beginning the next repetition. Repeat __________ times. Complete this exercise __________ times per day.  STRENGTH - Hip Extensors, Straight Leg Raises  Lie on your stomach on a firm surface.  Tense the muscles in your buttocks to lift your right / left leg about 4 inches. If you cannot lift your leg this high without arching your back, place a pillow under your hips.  Keep your knee straight. Hold __________ seconds.  Slowly lower your leg to the starting position and allow it to relax completely before starting the next repetition. Repeat __________ times. Complete this exercise __________ times per day.  STRENGTH - Hip  Adductors, Straight Leg Raises  Lie on your side so that your head, shoulders, knee and hip line up. You may place your upper foot in front, to help maintain your balance. Your right / left leg should be on the bottom.  Roll your hips slightly forward, so that your hips are stacked directly over each other and your right / left knee is facing forward.  Tense the muscles in your inner thigh and lift your bottom leg 4-6 inches. Hold this position for __________ seconds.  Slowly lower your leg to the starting position. Allow the muscles to fully relax before beginning the next repetition. Repeat __________ times. Complete this exercise __________ times per day.   This information is not intended to replace advice given to you by your health care provider. Make sure you discuss any questions you have with your health care provider.   Document Released: 02/25/2005 Document Revised: 07/12/2014 Document Reviewed: 06/09/2008 Elsevier Interactive Patient Education [employer] Inc[employer]

## 2015-03-14 NOTE — ED Provider Notes (Signed)
CSN: [id number]     Arrival date & time 03/14/15  1335 History   By signing my name below, I, Arlan Organ, attest that this documentation has been prepared under the direction and in the presence of Arby Barrette, MD.  Electronically Signed: Arlan Organ, ED Scribe. 03/14/2015. 6:19 PM.   Chief Complaint  Patient presents with  . Leg Injury   The history is provided by the patient. No language interpreter was used.    HPI Comments: Sean Leon is a 25 y.o. male without any pertinent past medical history who presents to the Emergency Department here for a L lower leg injury sustained 1 months ago. Pt states he fell on his knee a few weeks ago and noted a scar afterwards. Pt now c/o constant, ongoing pain and associated swelling to the L lower leg. No new injury to leg. Discomfort is exacerbated with ambulation. No alleviating factors at this time. No OTC medications or home remedies attempted prior to arrival. No recent fever, chills, nausea, or vomiting. No weakness, loss of sensation, or numbness. No known allergies to medications.  PCP: No PCP Per Patient    Past Medical History  Diagnosis Date  . Asthma    History reviewed. No pertinent past surgical history. No family history on file. Social History  Substance Use Topics  . Smoking status: Former Games developer  . Smokeless tobacco: None  . Alcohol Use: No    Review of Systems   A complete 10 system review of systems was obtained and all systems are negative except as noted in the HPI and PMH.     Allergies  Review of patient's allergies indicates no known allergies.  Home Medications   Prior to Admission medications   Medication Sig Start Date End Date Taking? Authorizing Provider  azithromycin (ZITHROMAX) 250 MG tablet 2  By mouth day one, then 1 by mouth daily days 2 through 5 12/20/14   Charlestine Night, PA-C  fluticasone-salmeterol (ADVAIR HFA) 115-21 MCG/ACT inhaler Inhale 2 puffs into the lungs 2 (two) times  daily.      Historical Provider, MD  Guaifenesin 1200 MG TB12 Take 1 tablet (1,200 mg total) by mouth 2 (two) times daily. 12/20/14   Charlestine Night, PA-C  HYDROcodone-acetaminophen (HYCET) 7.5-325 mg/15 ml solution Take 10 milliliters every 6 hours as needed for pain. 02/05/15   [name] Molpus, MD  naproxen (NAPROSYN) 500 MG tablet Take 1 tablet (500 mg total) by mouth 2 (two) times daily. 03/14/15   Arby Barrette, MD    Triage Vitals: BP 146/81 mmHg  Pulse 67  Temp(Src) 98.1 F (36.7 C) (Oral)  Resp 20  Ht 5\' 11"  (1.803 m)  Wt 220 lb (99.791 kg)  BMI 30.70 kg/m2  SpO2 99%    Physical Exam  Constitutional: He is oriented to person, place, and time. He appears well-developed and well-nourished.  HENT:  Head: Normocephalic.  Eyes: EOM are normal.  Neck: Normal range of motion.  Pulmonary/Chest: Effort normal.  Abdominal: He exhibits no distension.  Musculoskeletal: Normal range of motion.  Patient is able to stand and appropriate and weight-bear. There is a well-healed scar on the tibial tuberosity. No erythema and no swelling or edema. No joint effusion. The patient reports some decreased flexion range of motion and has flexion to approximately 100.  Neurological: He is alert and oriented to person, place, and time.  Psychiatric: He has a normal mood and affect.  Nursing note and vitals reviewed.   ED Course  Procedures (  including critical care time)  DIAGNOSTIC STUDIES: Oxygen Saturation is 99% on RA, Normal by my interpretation.    COORDINATION OF CARE: 6:17 PM- Will order imaging. Discussed treatment plan with pt at bedside and pt agreed to plan.     6:18 PM- Will refer to Dr. Norton BlizzardShane Hudnall- Sports Medicine.   Labs Review Labs Reviewed - No data to display  Imaging Review Dg Tibia/fibula Left  03/14/2015  CLINICAL DATA:  Larey SeatFell playing basketball 1 month ago. Pain just below the knee. Initial encounter. EXAM: LEFT TIBIA AND FIBULA - 2 VIEW COMPARISON:  None. FINDINGS:  There is no evidence of fracture or other focal bone lesions. Soft tissues are unremarkable. IMPRESSION: Negative. Electronically Signed   By: Sebastian AcheAllen  Grady M.D.   On: 03/14/2015 14:29   I have personally reviewed and evaluated these images and lab results as part of my medical decision-making.   EKG Interpretation None      MDM   Final diagnoses:  Tibial pain, left   Patient has some chronic pretibial pain after a fall a month ago. X-ray does not show any acute findings. There is no evidence of any joint effusion, cellulitis or bursitis. Patient is counseled on the possibility of scarring in the patellar tendon. He is advised to follow up with sports medicine for physical therapy and rehabilitation as needed.  Arby BarretteMarcy Pace Lamadrid, MD 03/14/15 [phone]

## 2015-03-14 NOTE — ED Notes (Signed)
Left lower leg pain and swelling. He fell a month ago.

## 2015-05-25 ENCOUNTER — Emergency Department (HOSPITAL_BASED_OUTPATIENT_CLINIC_OR_DEPARTMENT_OTHER)
Admission: EM | Admit: 2015-05-25 | Discharge: 2015-05-25 | Disposition: A | Discharge status: Home / Self Care | Payer: Self-pay | Attending: Physician Assistant | Admitting: Physician Assistant

## 2015-05-25 ENCOUNTER — Encounter (HOSPITAL_BASED_OUTPATIENT_CLINIC_OR_DEPARTMENT_OTHER): Payer: Self-pay | Admitting: Emergency Medicine

## 2015-05-25 ENCOUNTER — Emergency Department (HOSPITAL_BASED_OUTPATIENT_CLINIC_OR_DEPARTMENT_OTHER): Payer: Self-pay

## 2015-05-25 DIAGNOSIS — S93401A Sprain of unspecified ligament of right ankle, initial encounter: Secondary | ICD-10-CM | POA: Insufficient documentation

## 2015-05-25 DIAGNOSIS — F17210 Nicotine dependence, cigarettes, uncomplicated: Secondary | ICD-10-CM | POA: Insufficient documentation

## 2015-05-25 DIAGNOSIS — Y9367 Activity, basketball: Secondary | ICD-10-CM | POA: Insufficient documentation

## 2015-05-25 DIAGNOSIS — X58XXXA Exposure to other specified factors, initial encounter: Secondary | ICD-10-CM | POA: Insufficient documentation

## 2015-05-25 DIAGNOSIS — R52 Pain, unspecified: Secondary | ICD-10-CM

## 2015-05-25 DIAGNOSIS — Y92310 Basketball court as the place of occurrence of the external cause: Secondary | ICD-10-CM | POA: Insufficient documentation

## 2015-05-25 DIAGNOSIS — Z7951 Long term (current) use of inhaled steroids: Secondary | ICD-10-CM | POA: Insufficient documentation

## 2015-05-25 DIAGNOSIS — Z791 Long term (current) use of non-steroidal anti-inflammatories (NSAID): Secondary | ICD-10-CM | POA: Insufficient documentation

## 2015-05-25 DIAGNOSIS — Z79899 Other long term (current) drug therapy: Secondary | ICD-10-CM | POA: Insufficient documentation

## 2015-05-25 DIAGNOSIS — Y998 Other external cause status: Secondary | ICD-10-CM | POA: Insufficient documentation

## 2015-05-25 DIAGNOSIS — J45909 Unspecified asthma, uncomplicated: Secondary | ICD-10-CM | POA: Insufficient documentation

## 2015-05-25 MED ORDER — IBUPROFEN 800 MG PO TABS
800.0000 mg | ORAL_TABLET | Freq: Once | ORAL | Status: AC
  Administered 2015-05-25: 800 mg via ORAL
  Filled 2015-05-25: qty 1

## 2015-05-25 NOTE — Discharge Instructions (Signed)
1. Medications: tylenol or ibuprofen for pain, usual home medications 2. Treatment: rest, drink plenty of fluids, wear ankle brace, ice and elevate 3. Follow Up: please followup with orthopedics for persistent symptoms for discussion of your diagnoses and further evaluation after today's visit; if you do not have a primary care doctor use the phone number listed in your discharge paperwork to find one; please return to the ER for increased pain, swelling, numbness, new or worsening symptoms   Ankle Sprain An ankle sprain is an injury to the strong, fibrous tissues (ligaments) that hold your ankle bones together.  HOME CARE   Put ice on your ankle for 1-2 days or as told by your doctor.  Put ice in a plastic bag.  Place a towel between your skin and the bag.  Leave the ice on for 15-20 minutes at a time, every 2 hours while you are awake.  Only take medicine as told by your doctor.  Raise (elevate) your injured ankle above the level of your heart as much as possible for 2-3 days.  Use crutches if your doctor tells you to. Slowly put your own weight on the affected ankle. Use the crutches until you can walk without pain.  If you have a plaster splint:  Do not rest it on anything harder than a pillow for 24 hours.  Do not put weight on it.  Do not get it wet.  Take it off to shower or bathe.  If given, use an elastic wrap or support stocking for support. Take the wrap off if your toes lose feeling (numb), tingle, or turn cold or blue.  If you have an air splint:  Add or let out air to make it comfortable.  Take it off at night and to shower and bathe.  Wiggle your toes and move your ankle up and down often while you are wearing it. GET HELP IF:  You have rapidly increasing bruising or puffiness (swelling).  Your toes feel very cold.  You lose feeling in your foot.  Your medicine does not help your pain. GET HELP RIGHT AWAY IF:   Your toes lose feeling (numb) or turn  blue.  You have severe pain that is increasing. MAKE SURE YOU:   Understand these instructions.  Will watch your condition.  Will get help right away if you are not doing well or get worse.   This information is not intended to replace advice given to you by your health care provider. Make sure you discuss any questions you have with your health care provider.   Document Released: 08/14/2007 Document Revised: 03/18/2014 Document Reviewed: 09/09/2011 Elsevier Interactive Patient Education [employer] Elsevier Inc.

## 2015-05-25 NOTE — ED Notes (Signed)
Reports rolling ankle and hearing pop yesterday while playing basketball. Uneven gait at triage.

## 2015-05-25 NOTE — ED Provider Notes (Signed)
CSN: [id number]     Arrival date & time 05/25/15  1352 History   First MD Initiated Contact with Patient 05/25/15 1358     Chief Complaint  Patient presents with  . Ankle Pain    HPI   Sean Leon is a 25 y.o. male with a PMH of asthma who presents to the ED with right ankle pain. He states he was playing basketball yesterday and "came down on his ankle funny" and heard a pop. He reports constant pain since that time. He states bearing weight and movement exacerbate his pain. He has tried ibuprofen at home with no significant symptom relief. He denies numbness, weakness, paresthesia, additional injury.   Past Medical History  Diagnosis Date  . Asthma    History reviewed. No pertinent past surgical history. History reviewed. No pertinent family history. Social History  Substance Use Topics  . Smoking status: Light Tobacco Smoker    Types: Cigarettes  . Smokeless tobacco: None  . Alcohol Use: No     Review of Systems  Musculoskeletal: Positive for arthralgias.  Skin: Negative for wound.  Neurological: Negative for weakness and numbness.      Allergies  Review of patient's allergies indicates no known allergies.  Home Medications   Prior to Admission medications   Medication Sig Start Date End Date Taking? Authorizing Provider  azithromycin (ZITHROMAX) 250 MG tablet 2  By mouth day one, then 1 by mouth daily days 2 through 5 12/20/14   Charlestine Night, PA-C  fluticasone-salmeterol (ADVAIR HFA) 115-21 MCG/ACT inhaler Inhale 2 puffs into the lungs 2 (two) times daily.      Historical Provider, MD  Guaifenesin 1200 MG TB12 Take 1 tablet (1,200 mg total) by mouth 2 (two) times daily. 12/20/14   Charlestine Night, PA-C  HYDROcodone-acetaminophen (HYCET) 7.5-325 mg/15 ml solution Take 10 milliliters every 6 hours as needed for pain. 02/05/15   [name] Molpus, MD  naproxen (NAPROSYN) 500 MG tablet Take 1 tablet (500 mg total) by mouth 2 (two) times daily. 03/14/15   Arby Barrette, MD    BP 128/80 mmHg  Pulse 72  Temp(Src) 98.4 F (36.9 C) (Oral)  Resp 16  SpO2 100% Physical Exam  Constitutional: He is oriented to person, place, and time. He appears well-developed and well-nourished. No distress.  HENT:  Head: Normocephalic and atraumatic.  Right Ear: External ear normal.  Left Ear: External ear normal.  Nose: Nose normal.  Eyes: Conjunctivae and EOM are normal. Right eye exhibits no discharge. Left eye exhibits no discharge. No scleral icterus.  Neck: Normal range of motion. Neck supple.  Cardiovascular: Normal rate, regular rhythm and intact distal pulses.   Pulmonary/Chest: Effort normal and breath sounds normal. No respiratory distress.  Musculoskeletal: Normal range of motion. He exhibits edema. He exhibits no tenderness.  Mild edema to right ankle. No significant tenderness to palpation. No erythema or heat. Decreased range of motion of right ankle due to pain.  Neurological: He is alert and oriented to person, place, and time. He has normal strength. No sensory deficit.  Skin: Skin is warm and dry. He is not diaphoretic.  Psychiatric: He has a normal mood and affect. His behavior is normal.  Nursing note and vitals reviewed.   ED Course  Procedures (including critical care time)  Labs Review Labs Reviewed - No data to display  Imaging Review Dg Ankle Complete Right  05/25/2015  CLINICAL DATA:  Pain and swelling medially following rolling type injury 1 day prior  EXAM: RIGHT ANKLE - COMPLETE 3+ VIEW COMPARISON:  None. FINDINGS: Frontal, oblique, and lateral views obtained. There is mild soft tissue swelling. No fracture or effusion. The ankle mortise appears intact. No appreciable joint space narrowing. IMPRESSION: Mild soft tissue swelling. No demonstrable fracture. Mortise appears intact. Electronically Signed   By: Bretta BangWilliam  Woodruff III M.D.   On: 05/25/2015 14:18   I have personally reviewed and evaluated these images and lab results as  part of my medical decision-making.   EKG Interpretation None      MDM   Final diagnoses:  Right ankle sprain, initial encounter    25 year old male presents with right ankle pain. Patient is afebrile. Vital signs stable. On exam, patient has mild edema to his right ankle without significant tenderness to palpation. Decreased range of motion of right ankle due to pain. Patient is neurovascularly intact. Will obtain imaging of right ankle and give ice and ibuprofen for symptoms.  Imaging negative for fracture, reveals mild soft tissue swelling. Discussed findings with patient. He is nontoxic and well-appearing, feel he is stable for discharge at this time. Symptoms likely due to ankle sprain. Will give ankle brace. Advised to rest, ice, elevate, and take tylenol or ibuprofen for symptoms. Patient to follow-up with orthopedics for persistent symptoms. Return precautions discussed. Patient verbalizes his understanding and is in agreement with plan.  BP 128/80 mmHg  Pulse 72  Temp(Src) 98.4 F (36.9 C) (Oral)  Resp 16  SpO2 100%     Mady Gemmalizabeth C Westfall, PA-C 05/25/15 1432  Courteney Randall AnLyn Mackuen, MD 05/25/15 [phone]

## 2015-06-12 ENCOUNTER — Emergency Department (HOSPITAL_BASED_OUTPATIENT_CLINIC_OR_DEPARTMENT_OTHER): Admission: EM | Admit: 2015-06-12 | Discharge: 2015-06-12 | Discharge status: Absent without leave | Payer: Self-pay

## 2017-08-18 ENCOUNTER — Emergency Department (HOSPITAL_BASED_OUTPATIENT_CLINIC_OR_DEPARTMENT_OTHER): Payer: Self-pay

## 2017-08-18 ENCOUNTER — Emergency Department (HOSPITAL_BASED_OUTPATIENT_CLINIC_OR_DEPARTMENT_OTHER)
Admission: EM | Admit: 2017-08-18 | Discharge: 2017-08-19 | Disposition: A | Discharge status: Home / Self Care | Payer: Self-pay | Attending: Emergency Medicine | Admitting: Emergency Medicine

## 2017-08-18 ENCOUNTER — Encounter (HOSPITAL_BASED_OUTPATIENT_CLINIC_OR_DEPARTMENT_OTHER): Payer: Self-pay

## 2017-08-18 DIAGNOSIS — X500XXA Overexertion from strenuous movement or load, initial encounter: Secondary | ICD-10-CM | POA: Insufficient documentation

## 2017-08-18 DIAGNOSIS — Y929 Unspecified place or not applicable: Secondary | ICD-10-CM | POA: Insufficient documentation

## 2017-08-18 DIAGNOSIS — S93402A Sprain of unspecified ligament of left ankle, initial encounter: Secondary | ICD-10-CM | POA: Insufficient documentation

## 2017-08-18 DIAGNOSIS — Z79899 Other long term (current) drug therapy: Secondary | ICD-10-CM | POA: Insufficient documentation

## 2017-08-18 DIAGNOSIS — Y999 Unspecified external cause status: Secondary | ICD-10-CM | POA: Insufficient documentation

## 2017-08-18 DIAGNOSIS — Y939 Activity, unspecified: Secondary | ICD-10-CM | POA: Insufficient documentation

## 2017-08-18 MED ORDER — IBUPROFEN 800 MG PO TABS
800.0000 mg | ORAL_TABLET | Freq: Once | ORAL | Status: AC
  Administered 2017-08-18: 800 mg via ORAL
  Filled 2017-08-18: qty 1

## 2017-08-18 NOTE — ED Triage Notes (Signed)
Pt states injured left ankle 4 days ago-NAD-presents to triage in w/c-limping gait to stand for weight

## 2017-08-18 NOTE — ED Notes (Signed)
ED Provider at bedside. 

## 2017-08-19 NOTE — ED Notes (Signed)
Pt verbalizes understanding of d/c instructions and denies any further needs at this time. 

## 2017-08-19 NOTE — Discharge Instructions (Addendum)
Apply ice several times a day.  Wear ankle splint orthotic as needed.  Use crutches as needed.  Take ibuprofen, naproxen, or acetaminophen as needed for pain.

## 2017-08-19 NOTE — ED Provider Notes (Signed)
[facility] EMERGENCY DEPARTMENT Provider Note   CSN: [id number] Arrival date & time: 08/18/17  2147     History   Chief Complaint Chief Complaint  Patient presents with  . Ankle Injury    HPI JAVIEN TESCH is a 27 y.o. male.  The history is provided by the patient.  He has a history of asthma, and comes in four days after injuring his left ankle with an inversion injury. He is complaining of pain medially, laterally, and anteriorly. Pain is worse with weight bearing -7/10. He has treated it with ice and elevation.  Past Medical History:  Diagnosis Date  . Asthma     There are no active problems to display for this patient.   History reviewed. No pertinent surgical history.      Home Medications    Prior to Admission medications   Medication Sig Start Date End Date Taking? Authorizing Provider  azithromycin (ZITHROMAX) 250 MG tablet 2  By mouth day one, then 1 by mouth daily days 2 through 5 12/20/14   Lawyer, Cristal Deer, PA-C  fluticasone-salmeterol (ADVAIR HFA) 782-95 MCG/ACT inhaler Inhale 2 puffs into the lungs 2 (two) times daily.      [provider]  Guaifenesin 1200 MG TB12 Take 1 tablet (1,200 mg total) by mouth 2 (two) times daily. 12/20/14   Lawyer, Cristal Deer, PA-C  HYDROcodone-acetaminophen (HYCET) 7.5-325 mg/15 ml solution Take 10 milliliters every 6 hours as needed for pain. 02/05/15   Molpus, [name], MD  naproxen (NAPROSYN) 500 MG tablet Take 1 tablet (500 mg total) by mouth 2 (two) times daily. 03/14/15   Arby Barrette, MD    Family History No family history on file.  Social History Social History   Tobacco Use  . Smoking status: Never Smoker  . Smokeless tobacco: Never Used  Substance Use Topics  . Alcohol use: No    Comment: occ  . Drug use: No     Allergies   Patient has no known allergies.   Review of Systems Review of Systems  All other systems reviewed and are negative.    Physical Exam Updated  Vital Signs BP 126/76 (BP Location: Right Arm)   Pulse 71   Temp 98.2 F (36.8 C) (Oral)   Resp 16   Ht 5\' 11"  (1.803 m)   Wt 115.7 kg (255 lb)   SpO2 98%   BMI 35.57 kg/m   Physical Exam  Nursing note and vitals reviewed.  27 year old male, resting comfortably and in no acute distress. Vital signs are normal. Oxygen saturation is 98%, which is normal. Head is normocephalic and atraumatic. PERRLA, EOMI. Oropharynx is clear. Neck is nontender and supple without adenopathy or JVD. Back is nontender and there is no CVA tenderness. Lungs are clear without rales, wheezes, or rhonchi. Chest is nontender. Heart has regular rate and rhythm without murmur. Abdomen is soft, flat, nontender without masses or hepatosplenomegaly and peristalsis is normoactive. Extremities: Moderate swelling left ankle. Moderate tenderness medially, laterally, and anteriorly. No instability of the ankle mortise. Anterior drawer sign is negative. Distal neurovascular is intact with strong dorsalis pedis pulse, normal sensation, and prompt capillary refill.  Skin is warm and dry without rash. Neurologic: Mental status is normal, cranial nerves are intact, there are no motor or sensory deficits.  ED Treatments / Results   Radiology Dg Ankle Complete Left  Result Date: 08/18/2017 CLINICAL DATA:  Left ankle pain after injury. Rolled ankle while running 4 days ago. Pain, swelling  and limited range of motion. EXAM: LEFT ANKLE COMPLETE - 3+ VIEW COMPARISON:  None. FINDINGS: There is no evidence of fracture, dislocation, or joint effusion. The ankle mortise is preserved. There is no evidence of arthropathy or other focal bone abnormality. Diffuse soft tissue edema. IMPRESSION: Diffuse soft tissue edema.  No acute fracture. Electronically Signed   By: Rubye OaksMelanie  Ehinger M.D.   On: 08/18/2017 22:20    Procedures Procedures  Medications Ordered in ED Medications  ibuprofen (ADVIL,MOTRIN) tablet 800 mg (800 mg Oral Given  08/18/17 2356)     Initial Impression / Assessment and Plan / ED Course  I have reviewed the triage vital signs and the nursing notes.  Pertinent imaging results that were available during my care of the patient were reviewed by me and considered in my medical decision making (see chart for details).  Sprain of left ankle. X-ray is negative for fracture. He is placed in an ankle splint orthotic, given crutches, referred to sports medicine for follow up.  Final Clinical Impressions(s) / ED Diagnoses   Final diagnoses:  Sprain of left ankle, unspecified ligament, initial encounter    ED Discharge Orders    None       Dione BoozeGlick, Tatsuo Musial, MD 08/19/17 0015

## 2019-05-28 ENCOUNTER — Other Ambulatory Visit: Payer: Self-pay

## 2019-05-28 ENCOUNTER — Emergency Department (HOSPITAL_COMMUNITY): Payer: Managed Care, Other (non HMO)

## 2019-05-28 ENCOUNTER — Emergency Department (HOSPITAL_COMMUNITY)
Admission: EM | Admit: 2019-05-28 | Discharge: 2019-05-28 | Disposition: A | Discharge status: Home / Self Care | Payer: Managed Care, Other (non HMO) | Attending: Emergency Medicine | Admitting: Emergency Medicine

## 2019-05-28 DIAGNOSIS — M79675 Pain in left toe(s): Secondary | ICD-10-CM | POA: Diagnosis present

## 2019-05-28 DIAGNOSIS — J45909 Unspecified asthma, uncomplicated: Secondary | ICD-10-CM | POA: Diagnosis not present

## 2019-05-28 DIAGNOSIS — Z79899 Other long term (current) drug therapy: Secondary | ICD-10-CM | POA: Insufficient documentation

## 2019-05-28 LAB — CBG MONITORING, ED: Glucose-Capillary: 89 mg/dL (ref 70–99)

## 2019-05-28 NOTE — ED Provider Notes (Signed)
[facility] EMERGENCY DEPARTMENT Provider Note   CSN: [id number] Arrival date & time: 05/28/19  2147     History Chief Complaint  Patient presents with  . Toe Pain    Sean Leon is a 29 y.o. male.  Patient presents to the emergency department with a chief complaint of numbness to his left great toe.  He states the symptoms started today.  He reports some pain "inside" the toe.  Denies any fever.  Denies any injury.  Denies any history of diabetes.  Denies any treatments prior to arrival.  Denies any pain with movement.  Reports pain with palpation.  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  The history is provided by the patient. No language interpreter was used.       Past Medical History:  Diagnosis Date  . Asthma     There are no problems to display for this patient.   No past surgical history on file.     No family history on file.  Social History   Tobacco Use  . Smoking status: Never Smoker  . Smokeless tobacco: Never Used  Substance Use Topics  . Alcohol use: No    Comment: occ  . Drug use: No    Home Medications Prior to Admission medications   Medication Sig Start Date End Date Taking? Authorizing Provider  azithromycin (ZITHROMAX) 250 MG tablet 2  By mouth day one, then 1 by mouth daily days 2 through 5 12/20/14   Lawyer, Cristal Deer, PA-C  fluticasone-salmeterol (ADVAIR HFA) 431-54 MCG/ACT inhaler Inhale 2 puffs into the lungs 2 (two) times daily.      [provider]  Guaifenesin 1200 MG TB12 Take 1 tablet (1,200 mg total) by mouth 2 (two) times daily. 12/20/14   Lawyer, Cristal Deer, PA-C  HYDROcodone-acetaminophen (HYCET) 7.5-325 mg/15 ml solution Take 10 milliliters every 6 hours as needed for pain. 02/05/15   Molpus, [name], MD  naproxen (NAPROSYN) 500 MG tablet Take 1 tablet (500 mg total) by mouth 2 (two)  times daily. 03/14/15   Arby Barrette, MD    Allergies    Patient has no known allergies.  Review of Systems   Review of Systems  Constitutional: Negative for chills and fever.  Musculoskeletal: Positive for arthralgias. Negative for joint swelling and myalgias.  Skin: Negative for color change and wound.  Neurological: Positive for numbness. Negative for weakness.    Physical Exam Updated Vital Signs BP (!) 137/92 (BP Location: Left Arm)   Pulse 92   Temp 98.3 F (36.8 C) (Oral)   Resp 15   SpO2 100%   Physical Exam Vitals and nursing note reviewed.  Constitutional:      General: He is not in acute distress.    Appearance: He is well-developed. He is not ill-appearing.  HENT:     Head: Normocephalic and atraumatic.  Eyes:     Conjunctiva/sclera: Conjunctivae normal.  Cardiovascular:     Rate and Rhythm: Normal rate.  Pulmonary:     Effort: Pulmonary effort is normal. No respiratory distress.  Abdominal:     General: There is no distension.  Musculoskeletal:     Cervical back: Neck supple.       Feet:     Comments: ROM and strength of left great toe is 5/5 No bony deformity Mild tenderness  Skin:    General: Skin  is warm and dry.     Comments: Very mild erythema to the dorsal medial aspect of the left great toe, no abscess  Neurological:     Mental Status: He is alert and oriented to person, place, and time.     Comments: Very small area of localized numbness to the left great toe  Psychiatric:        Mood and Affect: Mood normal.        Behavior: Behavior normal.     ED Results / Procedures / Treatments   Labs (all labs ordered are listed, but only abnormal results are displayed) Labs Reviewed  CBG MONITORING, ED    EKG None  Radiology No results found.  Procedures Procedures (including critical care time)  Medications Ordered in ED Medications - No data to display  ED Course  I have reviewed the triage vital signs and the nursing  notes.  Pertinent labs & imaging results that were available during my care of the patient were reviewed by me and considered in my medical decision making (see chart for details).    MDM Rules/Calculators/A&P                      Patient with mild numbness to the left great toe as indicated above.  There is very faint erythema, but nothing that looks like a cellulitis or abscess.  Plain films are negative.  CBG normal, doubt diabetic neuropathy.  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  It is possible that he could have a mild case of frostbite, but the tissue has been rewarmed and is perfusing normally.  Recommend watchful waiting.   Final Clinical Impression(s) / ED Diagnoses Final diagnoses:  Pain of left great toe    Rx / DC Orders ED Discharge Orders    None       Montine Circle, PA-C 05/28/19 Omar, [place], DO 05/28/19 2352

## 2019-05-28 NOTE — ED Notes (Signed)
Patient verbalizes understanding of discharge instructions. Opportunity for questioning and answers were provided. Armband removed by staff, pt discharged from ED. Pt. ambulatory and discharged home.  

## 2019-05-28 NOTE — ED Triage Notes (Signed)
Top of the left toe numbness since today.  Pain in the bottom of the toe. Pt has speech impediment and requested triage rn to speak with his sister. She is in car and available when pt arrives to treatment room.

## 2019-05-28 NOTE — ED Notes (Signed)
Pt transported to xray 

## 2019-05-28 NOTE — Discharge Instructions (Signed)
No clear cause for your symptoms was found tonight.  It is possible the exposure to cold and wet the could have a mild case of frostbite.  Please see the handout.  Please return if symptoms worsen.

## 2020-03-23 ENCOUNTER — Emergency Department (HOSPITAL_BASED_OUTPATIENT_CLINIC_OR_DEPARTMENT_OTHER)
Admission: EM | Admit: 2020-03-23 | Discharge: 2020-03-23 | Disposition: A | Discharge status: Home / Self Care | Payer: Self-pay | Attending: Emergency Medicine | Admitting: Emergency Medicine

## 2020-03-23 ENCOUNTER — Encounter (HOSPITAL_BASED_OUTPATIENT_CLINIC_OR_DEPARTMENT_OTHER): Payer: Self-pay | Admitting: *Deleted

## 2020-03-23 ENCOUNTER — Emergency Department (HOSPITAL_BASED_OUTPATIENT_CLINIC_OR_DEPARTMENT_OTHER): Payer: Self-pay

## 2020-03-23 ENCOUNTER — Other Ambulatory Visit: Payer: Self-pay

## 2020-03-23 DIAGNOSIS — R079 Chest pain, unspecified: Secondary | ICD-10-CM

## 2020-03-23 DIAGNOSIS — J45909 Unspecified asthma, uncomplicated: Secondary | ICD-10-CM | POA: Insufficient documentation

## 2020-03-23 DIAGNOSIS — F419 Anxiety disorder, unspecified: Secondary | ICD-10-CM

## 2020-03-23 DIAGNOSIS — F17290 Nicotine dependence, other tobacco product, uncomplicated: Secondary | ICD-10-CM | POA: Insufficient documentation

## 2020-03-23 DIAGNOSIS — Z20822 Contact with and (suspected) exposure to covid-19: Secondary | ICD-10-CM | POA: Insufficient documentation

## 2020-03-23 DIAGNOSIS — Z91010 Allergy to peanuts: Secondary | ICD-10-CM | POA: Insufficient documentation

## 2020-03-23 DIAGNOSIS — Z7951 Long term (current) use of inhaled steroids: Secondary | ICD-10-CM | POA: Insufficient documentation

## 2020-03-23 LAB — URINALYSIS, ROUTINE W REFLEX MICROSCOPIC
Bilirubin Urine: NEGATIVE
Glucose, UA: NEGATIVE mg/dL
Hgb urine dipstick: NEGATIVE
Ketones, ur: NEGATIVE mg/dL
Leukocytes,Ua: NEGATIVE
Nitrite: NEGATIVE
Protein, ur: NEGATIVE mg/dL
Specific Gravity, Urine: 1.005 (ref 1.005–1.030)
pH: 8 (ref 5.0–8.0)

## 2020-03-23 LAB — CBC
HCT: 49.5 % (ref 39.0–52.0)
Hemoglobin: 16.8 g/dL (ref 13.0–17.0)
MCH: 30.2 pg (ref 26.0–34.0)
MCHC: 33.9 g/dL (ref 30.0–36.0)
MCV: 89 fL (ref 80.0–100.0)
Platelets: 222 10*3/uL (ref 150–400)
RBC: 5.56 MIL/uL (ref 4.22–5.81)
RDW: 12.6 % (ref 11.5–15.5)
WBC: 5.1 10*3/uL (ref 4.0–10.5)
nRBC: 0 % (ref 0.0–0.2)

## 2020-03-23 LAB — BASIC METABOLIC PANEL
Anion gap: 12 (ref 5–15)
BUN: 11 mg/dL (ref 6–20)
CO2: 27 mmol/L (ref 22–32)
Calcium: 9.3 mg/dL (ref 8.9–10.3)
Chloride: 97 mmol/L — ABNORMAL LOW (ref 98–111)
Creatinine, Ser: 1.2 mg/dL (ref 0.61–1.24)
GFR, Estimated: >60 mL/min (ref >60)
Glucose, Bld: 117 mg/dL — ABNORMAL HIGH (ref 70–99)
Potassium: 3.8 mmol/L (ref 3.5–5.1)
Sodium: 136 mmol/L (ref 135–145)

## 2020-03-23 LAB — SARS CORONAVIRUS 2 (TAT 6-24 HRS): SARS Coronavirus 2: NEGATIVE

## 2020-03-23 LAB — TROPONIN I (HIGH SENSITIVITY): Troponin I (High Sensitivity): 2 ng/L (ref <18)

## 2020-03-23 MED ORDER — LORAZEPAM 1 MG PO TABS
1.0000 mg | ORAL_TABLET | Freq: Once | ORAL | Status: AC
  Administered 2020-03-23: 1 mg via ORAL
  Filled 2020-03-23: qty 1

## 2020-03-23 MED ORDER — MELATONIN 10 MG PO CAPS: 10.0000 mg | ORAL_CAPSULE | Freq: Every day | ORAL | 0 refills | Status: AC

## 2020-03-23 MED ORDER — HYDROXYZINE HCL 25 MG PO TABS: 25.0000 mg | ORAL_TABLET | Freq: Four times a day (QID) | ORAL | 0 refills | Status: AC

## 2020-03-23 NOTE — Discharge Instructions (Addendum)
Your work-up today was reassuring.  Please follow-up with your primary care doctor.  If you have any new or concerning symptoms you may always return to the ER-as we discussed.  The meantime please take Tylenol and ibuprofen for pain.  Please drink plenty of water.  Use hydroxyzine as prescribed.  Use it up to every 6 hours.  Please use Tylenol or ibuprofen for pain.  You may use 600 mg ibuprofen every 6 hours or 1000 mg of Tylenol every 6 hours.  You may choose to alternate between the 2.  This would be most effective.  Not to exceed 4 g of Tylenol within 24 hours.  Not to exceed 3200 mg ibuprofen 24 hours.

## 2020-03-23 NOTE — ED Notes (Signed)
RT ambulated patient with pulse ox, SAT 98%. No SOB or increased HR

## 2020-03-23 NOTE — ED Provider Notes (Signed)
[facility] EMERGENCY DEPARTMENT Provider Note   CSN: [id number] Arrival date & time: 03/23/20  4431     History Chief Complaint  Patient presents with  . Chest Pain    Sean Leon is a 30 y.o. male.  HPI Patient is a 30 year old male with past medical history significant for asthma presented today with intermittent migratory left and right-sided chest pain he states that he woke up with the symptoms at 4 AM this morning.  He states that he feels dehydrated he has been peeing 4-5 times per day he states that since he drinks water he immediately pees.  He denies any dysuria frequency or urgency and he denies any penile pain or discharge.  He states he has no known history of diabetes.  He is not currently on any medications has been diagnosed with asthma in the past but has not needed to use his inhaler for years.  He states that when he woke up this morning of chest pain he felt short of breath and felt "berserk "very anxious and jittery and could not sit still.  He took several doses of his albuterol inhaler.  Since that time he has had increased palpitations and jitteriness.  He feels that he cannot sit still.  Notably patient states that he has also had difficulty sleeping over the past week.  He attributes this to the feeling jittery and anxious.  He has not had any chest pain during this time--until today.  And associate symptom the patient also describes is positional vertiginous symptoms.  He states that when he stands up he feels like the room spins this does not occur with looking right or left and does not occur at rest.  He denies any weakness or numbness in his upper or lower extremities no slurred speech or confusion.  Girlfriend at bedside is able to confirm that he has not been altered at all however he has seemed very jittery.  He states that his chest pain is nonpleuritic and nonexertional.  No other associate symptoms.  No aggravating mitigating factors.  He  has not had polyphasia he has had no changes with his bowel movements denies any abdominal pain or lightheadedness.    Past Medical History:  Diagnosis Date  . Asthma     There are no problems to display for this patient.   History reviewed. No pertinent surgical history.     History reviewed. No pertinent family history.  Social History   Tobacco Use  . Smoking status: Current Every Day Smoker    Types: Cigars  . Smokeless tobacco: Never Used  . Tobacco comment: occasionally  Substance Use Topics  . Alcohol use: Yes    Comment: occ  . Drug use: Not Currently    Types: Marijuana    Home Medications Prior to Admission medications   Medication Sig Start Date End Date Taking? Authorizing Provider  hydrOXYzine (ATARAX/VISTARIL) 25 MG tablet Take 1 tablet (25 mg total) by mouth every 6 (six) hours for 14 days. 03/23/20 04/06/20 Yes Mea Ozga S, PA  Melatonin 10 MG CAPS Take 10 mg by mouth at bedtime for 21 days. 03/23/20 04/13/20 Yes Cynthie Garmon, Rodrigo Ran, PA  azithromycin (ZITHROMAX) 250 MG tablet 2  By mouth day one, then 1 by mouth daily days 2 through 5 12/20/14   Lawyer, Cristal Deer, PA-C  fluticasone-salmeterol (ADVAIR HFA) 115-21 MCG/ACT inhaler Inhale 2 puffs into the lungs 2 (two) times daily.      [provider]  Guaifenesin 1200 MG TB12 Take 1 tablet (1,200 mg total) by mouth 2 (two) times daily. 12/20/14   Lawyer, Cristal Deer, PA-C  HYDROcodone-acetaminophen (HYCET) 7.5-325 mg/15 ml solution Take 10 milliliters every 6 hours as needed for pain. 02/05/15   Molpus, [name], MD  naproxen (NAPROSYN) 500 MG tablet Take 1 tablet (500 mg total) by mouth 2 (two) times daily. 03/14/15   Arby Barrette, MD    Allergies    Peanut-containing drug products and Eggs or egg-derived products  Review of Systems   Review of Systems  Constitutional: Negative for chills and fever.  HENT: Negative for congestion.   Eyes: Negative for pain.  Respiratory: Negative for cough and  shortness of breath.   Cardiovascular: Positive for chest pain. Negative for leg swelling.  Gastrointestinal: Negative for abdominal pain and vomiting.  Genitourinary: Negative for dysuria.  Musculoskeletal: Negative for myalgias.  Skin: Negative for rash.  Neurological: Negative for dizziness and headaches.  Psychiatric/Behavioral: Positive for agitation and sleep disturbance. Negative for suicidal ideas. The patient is nervous/anxious.     Physical Exam Updated Vital Signs BP 129/70   Pulse 84   Temp 98.4 F (36.9 C) (Oral)   Resp 19   Ht 5\' 11"  (1.803 m)   Wt 113.4 kg   SpO2 99%   BMI 34.87 kg/m   Physical Exam Vitals and nursing note reviewed.  Constitutional:      General: He is not in acute distress.    Comments: Pleasant well-appearing 30 year old male appears stated age.  In no acute distress.  Sitting comfortably in bed.  Able answer questions appropriately follow commands. No increased work of breathing. Speaking in full sentences.  HENT:     Head: Normocephalic and atraumatic.     Nose: Nose normal.     Mouth/Throat:     Mouth: Mucous membranes are moist.  Eyes:     General: No scleral icterus. Cardiovascular:     Rate and Rhythm: Normal rate and regular rhythm.     Pulses: Normal pulses.     Heart sounds: Normal heart sounds.     Comments: Bilateral radial artery pulses are 3+ and symmetric. No reproducible anterior chest wall tenderness to palpation. Pulmonary:     Effort: Pulmonary effort is normal. No respiratory distress.     Breath sounds: No wheezing.  Abdominal:     Palpations: Abdomen is soft.     Tenderness: There is no abdominal tenderness.     Comments: Abdomen is obese nontender no guarding or rebound.  Musculoskeletal:     Cervical back: Normal range of motion.     Right lower leg: No edema.     Left lower leg: No edema.  Skin:    General: Skin is warm and dry.     Capillary Refill: Capillary refill takes less than 2 seconds.   Neurological:     Mental Status: He is alert. Mental status is at baseline.  Psychiatric:        Mood and Affect: Mood normal.        Behavior: Behavior normal.     ED Results / Procedures / Treatments   Labs (all labs ordered are listed, but only abnormal results are displayed) Labs Reviewed  BASIC METABOLIC PANEL - Abnormal; Notable for the following components:      Result Value   Chloride 97 (*)    Glucose, Bld 117 (*)    All other components within normal limits  URINALYSIS, ROUTINE W REFLEX MICROSCOPIC - Abnormal; Notable  for the following components:   Color, Urine STRAW (*)    All other components within normal limits  SARS CORONAVIRUS 2 (TAT 6-24 HRS)  CBC  TROPONIN I (HIGH SENSITIVITY)    EKG EKG Interpretation  Date/Time:  Thursday March 23 2020 09:54:53 EST Ventricular Rate:  77 PR Interval:    QRS Duration: 102 QT Interval:  365 QTC Calculation: 413 R Axis:   101 Text Interpretation: Sinus rhythm Right axis deviation Baseline wander in lead(s) V1 V2 V4 No significant change since last tracing Confirmed by Susy FrizzleSheldon, Charles [phone]([id number]) on 03/23/2020 10:05:56 AM   Radiology DG Chest Port 1 View  Result Date: 03/23/2020 CLINICAL DATA:  RIGHT-side chest pain since 0400 hours, anxiety, smoker EXAM: PORTABLE CHEST 1 VIEW COMPARISON:  Portable exam 1130 hours compared to 10/20/2014 FINDINGS: Normal heart size, mediastinal contours, and pulmonary vascularity. Lungs clear. No infiltrate, pleural effusion, or pneumothorax. Osseous structures unremarkable. IMPRESSION: No acute abnormalities. Electronically Signed   By: Ulyses SouthwardMark  Boles M.D.   On: 03/23/2020 11:37    Procedures Procedures (including critical care time)  Medications Ordered in ED Medications  LORazepam (ATIVAN) tablet 1 mg (1 mg Oral Given 03/23/20 1144)    ED Course  I have reviewed the triage vital signs and the nursing notes.  Pertinent labs & imaging results that were available during my care of the  patient were reviewed by me and considered in my medical decision making (see chart for details).  Patient is 30 year old male presenting today with multiple complaints.  He has had difficulty sleeping over the past week.  Seems to have some issues with anxiety.  Overall he is very well-appearing.  Is PERC negative.  I have low suspicion for ACS.  Will obtain basic lab work as well as single troponin to rule out myocarditis. Chest x-ray reviewed and there is no acute abnormality no infiltrate or pneumothorax.  Urinalysis unremarkable.  BMP without any significant electrolyte derangements.  CBC without leukocytosis or anemia troponin x1 with result of 2.  Patient was given 1 dose of Ativan and sent home with hydroxyzine.  He feels significantly improved at this time.   Clinical Course as of 03/23/20 2023  Thu Mar 23, 2020  1035 EKG unremarkable.  Specifically no ST-T wave abnormalities no S1Q3T3, right axis deviation is marginal. [WF]    Clinical Course User Index [WF] Gailen ShelterFondaw, Jourden Gilson S, [place]   MDM Rules/Calculators/A&P                          All questions answered best my ability.  He will closely follow-up with his primary care doctor.  Given melatonin for difficulty sleeping and hydroxyzine to use every 6 hours as needed for anxiety.  Return precautions were given as well.  Sean Leon was evaluated in Emergency Department on 03/23/2020 for the symptoms described in the history of present illness. He was evaluated in the context of the global COVID-19 pandemic, which necessitated consideration that the patient might be at risk for infection with the SARS-CoV-2 virus that causes COVID-19. Institutional protocols and algorithms that pertain to the evaluation of patients at risk for COVID-19 are in a state of rapid change based on information released by regulatory bodies including the CDC and federal and state organizations. These policies and algorithms were followed during the patient's  care in the ED.  Final Clinical Impression(s) / ED Diagnoses Final diagnoses:  Chest pain, unspecified type  Anxiety  Rx / DC Orders ED Discharge Orders         Ordered    hydrOXYzine (ATARAX/VISTARIL) 25 MG tablet  Every 6 hours        03/23/20 1256    Melatonin 10 MG CAPS  Daily at bedtime        03/23/20 1301           Gailen Shelter, [place] 03/23/20 2024    Pollyann Savoy, MD 03/24/20 1446

## 2020-03-23 NOTE — ED Triage Notes (Addendum)
Right chest pain started at 4 am.  He felt dehydrated.  Every time he drinks, he urinates.  Intermittent sharp pain to right & left chest.  He felt like it is an anxiety.

## 2020-11-28 ENCOUNTER — Other Ambulatory Visit: Payer: Self-pay

## 2020-11-28 ENCOUNTER — Encounter (HOSPITAL_BASED_OUTPATIENT_CLINIC_OR_DEPARTMENT_OTHER): Payer: Self-pay | Admitting: Emergency Medicine

## 2020-11-28 ENCOUNTER — Emergency Department (HOSPITAL_BASED_OUTPATIENT_CLINIC_OR_DEPARTMENT_OTHER)
Admission: EM | Admit: 2020-11-28 | Discharge: 2020-11-28 | Disposition: A | Discharge status: Home / Self Care | Payer: Self-pay | Attending: Emergency Medicine | Admitting: Emergency Medicine

## 2020-11-28 DIAGNOSIS — Z7951 Long term (current) use of inhaled steroids: Secondary | ICD-10-CM | POA: Insufficient documentation

## 2020-11-28 DIAGNOSIS — B349 Viral infection, unspecified: Secondary | ICD-10-CM

## 2020-11-28 DIAGNOSIS — J45909 Unspecified asthma, uncomplicated: Secondary | ICD-10-CM | POA: Insufficient documentation

## 2020-11-28 DIAGNOSIS — Z87891 Personal history of nicotine dependence: Secondary | ICD-10-CM | POA: Insufficient documentation

## 2020-11-28 DIAGNOSIS — U071 COVID-19: Secondary | ICD-10-CM | POA: Insufficient documentation

## 2020-11-28 DIAGNOSIS — Z91010 Allergy to peanuts: Secondary | ICD-10-CM | POA: Insufficient documentation

## 2020-11-28 LAB — SARS CORONAVIRUS 2 (TAT 6-24 HRS): SARS Coronavirus 2: POSITIVE — AB

## 2020-11-28 MED ORDER — ACETAMINOPHEN 325 MG PO TABS
650.0000 mg | ORAL_TABLET | Freq: Once | ORAL | Status: AC
  Administered 2020-11-28: 650 mg via ORAL
  Filled 2020-11-28: qty 2

## 2020-11-28 NOTE — ED Provider Notes (Signed)
[facility] EMERGENCY DEPARTMENT Provider Note   CSN: [id number] Arrival date & time: 11/28/20  8099     History Chief Complaint  Patient presents with   COVID symptoms    [facility] is a 30 y.o. male who presents with concern for chills, fatigue, and body aches x 3 days.  Presents to the ER today due to fever 101 F at home.  He is febrile upon arrival here as well.  Denies any chest pain or shortness of breath, coughing, congestion, abdominal pain, nausea, vomiting, or diarrhea.  I personally reviewed this patient's medical records.  His history of asthma with as needed albuterol.  He otherwise does not carry any medical diagnoses and is not on any medications every day.  HPI     Past Medical History:  Diagnosis Date   Asthma     There are no problems to display for this patient.   History reviewed. No pertinent surgical history.     No family history on file.  Social History   Tobacco Use   Smoking status: Former    Types: Cigars   Smokeless tobacco: Never   Tobacco comments:    occasionally  Substance Use Topics   Alcohol use: Not Currently    Comment: occ   Drug use: Not Currently    Types: Marijuana    Home Medications Prior to Admission medications   Medication Sig Start Date End Date Taking? Authorizing Provider  budesonide-formoterol (SYMBICORT) 160-4.5 MCG/ACT inhaler Inhale 2 puffs into the lungs daily.   Yes [provider]  azithromycin (ZITHROMAX) 250 MG tablet 2  By mouth day one, then 1 by mouth daily days 2 through 5 12/20/14   Lawyer, Cristal Deer, PA-C  fluticasone-salmeterol (ADVAIR HFA) 115-21 MCG/ACT inhaler Inhale 2 puffs into the lungs 2 (two) times daily.      [provider]  Guaifenesin 1200 MG TB12 Take 1 tablet (1,200 mg total) by mouth 2 (two) times daily. 12/20/14   Lawyer, Cristal Deer, PA-C  HYDROcodone-acetaminophen (HYCET) 7.5-325 mg/15 ml solution Take 10 milliliters every 6 hours as  needed for pain. 02/05/15   Molpus, [name], MD  naproxen (NAPROSYN) 500 MG tablet Take 1 tablet (500 mg total) by mouth 2 (two) times daily. 03/14/15   Arby Barrette, MD    Allergies    Peanut-containing drug products and Eggs or egg-derived products  Review of Systems   Review of Systems  Constitutional:  Positive for activity change, chills, fatigue and fever. Negative for appetite change.  HENT: Negative.    Respiratory: Negative.    Cardiovascular: Negative.   Gastrointestinal: Negative.   Genitourinary: Negative.   Musculoskeletal:  Positive for myalgias. Negative for neck pain and neck stiffness.  Skin: Negative.   Neurological:  Positive for light-headedness. Negative for dizziness, tremors, syncope, weakness and headaches.   Physical Exam Updated Vital Signs BP 123/70 (BP Location: Left Arm)   Pulse 96   Temp (!) 101.3 F (38.5 C) (Oral)   Resp 18   SpO2 100%   Physical Exam Vitals and nursing note reviewed.  Constitutional:      General: He is not in acute distress.    Appearance: He is not ill-appearing or toxic-appearing.  HENT:     Head: Normocephalic and atraumatic.     Nose: Nose normal. No congestion.     Mouth/Throat:     Mouth: Mucous membranes are moist.     Pharynx: Oropharynx is clear. Uvula midline. No oropharyngeal exudate or posterior  oropharyngeal erythema.     Tonsils: No tonsillar exudate.  Eyes:     General: Lids are normal. Vision grossly intact.        Right eye: No discharge.        Left eye: No discharge.     Extraocular Movements: Extraocular movements intact.     Conjunctiva/sclera: Conjunctivae normal.     Pupils: Pupils are equal, round, and reactive to light.  Neck:     Trachea: Trachea and phonation normal.     Meningeal: Brudzinski's sign and Kernig's sign absent.  Cardiovascular:     Rate and Rhythm: Normal rate and regular rhythm.     Pulses: Normal pulses.     Heart sounds: Normal heart sounds. No murmur heard. Pulmonary:      Effort: Pulmonary effort is normal. No tachypnea, bradypnea, accessory muscle usage, prolonged expiration or respiratory distress.     Breath sounds: Normal breath sounds. No wheezing or rales.  Chest:     Chest wall: No mass, lacerations, deformity, swelling, tenderness, crepitus or edema.  Abdominal:     General: Bowel sounds are normal. There is no distension.     Palpations: Abdomen is soft.     Tenderness: There is no abdominal tenderness. There is no right CVA tenderness, left CVA tenderness, guarding or rebound.  Musculoskeletal:        General: No deformity.     Cervical back: Normal range of motion and neck supple. No edema, rigidity or crepitus. No pain with movement, spinous process tenderness or muscular tenderness.     Right lower leg: No edema.     Left lower leg: No edema.  Lymphadenopathy:     Cervical: No cervical adenopathy.  Skin:    General: Skin is warm and dry.     Capillary Refill: Capillary refill takes less than 2 seconds.     Findings: No rash.  Neurological:     General: No focal deficit present.     Mental Status: He is alert and oriented to person, place, and time. Mental status is at baseline.     Gait: Gait is intact.  Psychiatric:        Mood and Affect: Mood normal.    ED Results / Procedures / Treatments   Labs (all labs ordered are listed, but only abnormal results are displayed) Labs Reviewed  SARS CORONAVIRUS 2 (TAT 6-24 HRS)    EKG None  Radiology No results found.  Procedures Procedures   Medications Ordered in ED Medications  acetaminophen (TYLENOL) tablet 650 mg (650 mg Oral Given 11/28/20 7619)    ED Course  I have reviewed the triage vital signs and the nursing notes.  Pertinent labs & imaging results that were available during my care of the patient were reviewed by me and considered in my medical decision making (see chart for details).    MDM Rules/Calculators/A&P                         30 year old male presents  with concern for 3 days of fevers, chills, myalgias.  Not vaccinated for COVID-19.  Differential diagnosis includes but is limited to acute viral illness such as COVID-19 or influenza from sepsis.  Febrile to 101.3 degrees Fahrenheit on intake, vital signs are otherwise normal.  Cardiopulmonary exam is normal, abdominal exam is benign.  HEENT exam is unremarkable.  Patient is neurovascularly intact in all 4 extremities and is ambulatory in the ED.  Respiratory pathogen  panel pending at this time, patient may follow test results in his MyChart account.  No further work-up warranted needed this time.  Kairyn voiced understanding with medical evaluation and treatment plan.  Each of his questions answered to his expressed satisfaction.  Patient is well-appearing, stable, and appropriate for discharge at this time.  This chart was dictated using voice recognition software, Dragon. Despite the best efforts of this provider to proofread and correct errors, errors may still occur which can change documentation meaning.  LEANDER TOUT was evaluated in Emergency Department on 11/28/2020 for the symptoms described in the history of present illness. He was evaluated in the context of the global COVID-19 pandemic, which necessitated consideration that the patient might be at risk for infection with the SARS-CoV-2 virus that causes COVID-19. Institutional protocols and algorithms that pertain to the evaluation of patients at risk for COVID-19 are in a state of rapid change based on information released by regulatory bodies including the CDC and federal and state organizations. These policies and algorithms were followed during the patient's care in the ED.  Final Clinical Impression(s) / ED Diagnoses Final diagnoses:  Viral illness    Rx / DC Orders ED Discharge Orders     None        Sherrilee Gilles 11/28/20 1039    Pricilla Loveless, MD 11/28/20 1105

## 2020-11-28 NOTE — Discharge Instructions (Addendum)
You were seen in the ER today for your chills and body aches.  You have been tested for COVID-19, your physical exam and vital signs otherwise reassuring.  Please follow your test results in your MyChart account.  May alternate Tylenol and ibuprofen as needed for your body aches and or fever. Return to the ER with any new chest pain, difficulty breathing, nausea or vomiting does not stop, or any other new severe symptoms.

## 2021-03-08 ENCOUNTER — Other Ambulatory Visit (HOSPITAL_BASED_OUTPATIENT_CLINIC_OR_DEPARTMENT_OTHER): Payer: Self-pay

## 2021-03-08 ENCOUNTER — Encounter (HOSPITAL_BASED_OUTPATIENT_CLINIC_OR_DEPARTMENT_OTHER): Payer: Self-pay

## 2021-03-08 ENCOUNTER — Emergency Department (HOSPITAL_BASED_OUTPATIENT_CLINIC_OR_DEPARTMENT_OTHER): Payer: Self-pay

## 2021-03-08 ENCOUNTER — Emergency Department (HOSPITAL_BASED_OUTPATIENT_CLINIC_OR_DEPARTMENT_OTHER)
Admission: EM | Admit: 2021-03-08 | Discharge: 2021-03-08 | Disposition: A | Discharge status: Home / Self Care | Payer: Self-pay | Attending: Emergency Medicine | Admitting: Emergency Medicine

## 2021-03-08 ENCOUNTER — Other Ambulatory Visit: Payer: Self-pay

## 2021-03-08 DIAGNOSIS — Z7951 Long term (current) use of inhaled steroids: Secondary | ICD-10-CM | POA: Insufficient documentation

## 2021-03-08 DIAGNOSIS — U071 COVID-19: Secondary | ICD-10-CM | POA: Insufficient documentation

## 2021-03-08 DIAGNOSIS — J101 Influenza due to other identified influenza virus with other respiratory manifestations: Secondary | ICD-10-CM | POA: Insufficient documentation

## 2021-03-08 DIAGNOSIS — Z87891 Personal history of nicotine dependence: Secondary | ICD-10-CM | POA: Insufficient documentation

## 2021-03-08 DIAGNOSIS — Z91010 Allergy to peanuts: Secondary | ICD-10-CM | POA: Insufficient documentation

## 2021-03-08 DIAGNOSIS — J45909 Unspecified asthma, uncomplicated: Secondary | ICD-10-CM | POA: Insufficient documentation

## 2021-03-08 LAB — RESP PANEL BY RT-PCR (FLU A&B, COVID) ARPGX2
Influenza A by PCR: POSITIVE — AB
Influenza B by PCR: NEGATIVE
SARS Coronavirus 2 by RT PCR: POSITIVE — AB

## 2021-03-08 MED ORDER — BENZONATATE 100 MG PO CAPS
100.0000 mg | ORAL_CAPSULE | Freq: Three times a day (TID) | ORAL | 0 refills | Status: AC | PRN
  Filled 2021-03-08: qty 21, 7d supply, fill #0

## 2021-03-08 MED ORDER — ACETAMINOPHEN 500 MG PO TABS
1000.0000 mg | ORAL_TABLET | Freq: Once | ORAL | Status: AC
  Administered 2021-03-08: 15:00:00 1000 mg via ORAL
  Filled 2021-03-08: qty 2

## 2021-03-08 NOTE — ED Notes (Signed)
Ambulated pt with pulse oximetry. Maintained SpO2 at 96% on RA, heart rate increased from 107-117

## 2021-03-08 NOTE — ED Triage Notes (Signed)
Pt c/o flu like sx started yesterday-NAD-steady gait 

## 2021-03-08 NOTE — Discharge Instructions (Signed)
You came to the emergency department today with reports of Covid-19 like symptoms.   You tested positive for COVID-19 and influenza. Please isolate at home for at least 7 days after the day your symptoms initially began, and THEN at least 24 hours after you are fever-free without the help of medications (Tylenol/acetaminophen and Advil/ibuprofen/Motrin) AND your symptoms are improving.  You can alternate Tylenol/acetaminophen and Advil/ibuprofen/Motrin every 4 hours for sore throat, body aches, headache or fever.  Drink plenty of water.  Use saline nasal spray for congestion. You can take Tessalon every 8 hours as needed for cough. Wash your hands frequently. Please rest as needed with frequent repositioning and ambulation as tolerated.    If you use a CPAP or BiPAP device for management of obstructive sleep apnea may continue to use it however use it when isolated from other individuals to avoid spread of COVID-19.   If you use a nebulizer administer medication such as albuterol you may continue to use it however only one isolated from other individuals to avoid the spread of COVID-19.  If your symptoms do not improve please follow-up with your primary care provider or urgent care.  Return to the ER for significant shortness of breath, uncontrollable vomiting, severe chest pain, inability to tolerate fluids, changes in mental status such as confusion or other concerning symptoms.

## 2021-03-08 NOTE — ED Provider Notes (Signed)
[facility] Provider Note   CSN: [id number] Arrival date & time: 03/08/21  1317     History Chief Complaint  Patient presents with   Cough    Sean Leon is a 30 y.o. male with history of asthma.  Presents to the emergency department with a complaint of flulike symptoms.  States that flulike symptoms started yesterday.  Patient endorses Leon, Sean Leon, chills, fever, cough, and nasal congestion.  Patient reports that he was exposed to COVID-19 from employees at work.  Patient has not been vaccinated for COVID-19 or influenza.  Denies any shortness of breath, wheezing, sore throat, abdominal pain, nausea, vomiting, diarrhea.   Cough Cough characteristics:  Productive Sputum characteristics:  Green Severity:  Unable to specify Onset quality:  Unable to specify Duration:  2 days Timing:  Constant Progression:  Unchanged Chronicity:  New Relieved by:  None tried Associated symptoms: chills, fever, headaches, myalgias and sinus congestion   Associated symptoms: no chest pain, no rash, no rhinorrhea, no shortness of breath and no sore throat       Past Medical History:  Diagnosis Date   Asthma     There are no problems to display for this patient.   History reviewed. No pertinent surgical history.     No family history on file.  Social History   Tobacco Use   Smoking status: Former    Types: Cigars   Smokeless tobacco: Never   Tobacco comments:    occasionally  Vaping Use   Vaping Use: Never used  Substance Use Topics   Alcohol use: Not Currently   Drug use: Not Currently    Home Medications Prior to Admission medications   Medication Sig Start Date End Date Taking? Authorizing Provider  azithromycin (ZITHROMAX) 250 MG tablet 2  By mouth day one, then 1 by mouth daily days 2 through 5 12/20/14   Lawyer, Harrell Gave, PA-C  budesonide-formoterol [facility]) 160-4.5 MCG/ACT inhaler Inhale 2 puffs into  the lungs daily.    [provider]  fluticasone-salmeterol (ADVAIR HFA) 115-21 MCG/ACT inhaler Inhale 2 puffs into the lungs 2 (two) times daily.      [provider]  Guaifenesin 1200 MG TB12 Take 1 tablet (1,200 mg total) by mouth 2 (two) times daily. 12/20/14   Lawyer, Harrell Gave, PA-C  HYDROcodone-acetaminophen (HYCET) 7.5-325 mg/15 ml solution Take 10 milliliters every 6 hours as needed for pain. 02/05/15   Molpus, [name], MD  naproxen (NAPROSYN) 500 MG tablet Take 1 tablet (500 mg total) by mouth 2 (two) times daily. 03/14/15   [name], MD    Allergies    Peanut-containing drug products and Eggs or egg-derived products  Review of Systems   Review of Systems  Constitutional:  Positive for chills and fever.  HENT:  Positive for congestion. Negative for rhinorrhea, sore throat and trouble swallowing.   Eyes:  Negative for visual disturbance.  Respiratory:  Positive for cough. Negative for shortness of breath.   Cardiovascular:  Negative for chest pain.  Gastrointestinal:  Negative for abdominal pain, diarrhea, nausea and vomiting.  Genitourinary:  Negative for difficulty urinating and dysuria.  Musculoskeletal:  Positive for myalgias. Negative for back pain and neck pain.  Skin:  Negative for color change and rash.  Neurological:  Positive for headaches. Negative for dizziness, syncope and light-headedness.  Psychiatric/Behavioral:  Negative for confusion.    Physical Exam Updated Vital Signs BP 133/83 (BP Location: Left Arm)    Pulse (!) 118  Temp (!) 100.5 F (38.1 C) (Oral)    Resp 18    SpO2 96%   Physical Exam Vitals and nursing note reviewed.  Constitutional:      General: He is not in acute distress.    Appearance: He is ill-appearing. He is not toxic-appearing or diaphoretic.  HENT:     Head: Normocephalic.  Eyes:     General: No scleral icterus.       Right eye: No discharge.        Left eye: No discharge.  Cardiovascular:     Rate and  Rhythm: Normal rate.  Pulmonary:     Effort: Pulmonary effort is normal. No tachypnea or bradypnea.     Breath sounds: Normal breath sounds. No stridor. No decreased breath sounds, wheezing, rhonchi or rales.     Comments: Speaks in full sentences without difficulty Abdominal:     General: Abdomen is flat. There is no distension. There are no signs of injury.     Palpations: There is no mass or pulsatile mass.     Tenderness: There is no abdominal tenderness. There is no guarding or rebound.  Skin:    General: Skin is warm and dry.  Neurological:     General: No focal deficit present.     Mental Status: He is alert.     GCS: GCS eye subscore is 4. GCS verbal subscore is 5. GCS motor subscore is 6.  Psychiatric:        Behavior: Behavior is cooperative.    ED Results / Procedures / Treatments   Labs (all labs ordered are listed, but only abnormal results are displayed) Labs Reviewed  RESP PANEL BY RT-PCR (FLU A&B, COVID) ARPGX2 - Abnormal; Notable for the following components:      Result Value   SARS Coronavirus 2 by RT PCR POSITIVE (*)    Influenza A by PCR POSITIVE (*)    All other components within normal limits    EKG None  Radiology DG Chest Portable 1 View  Result Date: 03/08/2021 CLINICAL DATA:  30 year old male with cough EXAM: PORTABLE CHEST 1 VIEW COMPARISON:  03/23/2020 FINDINGS: Cardiomediastinal silhouette unchanged in size and contour. No evidence of central vascular congestion. No interlobular septal thickening. No pneumothorax or pleural effusion. No confluent airspace disease. No acute displaced fracture IMPRESSION: No active disease. Electronically Signed   By: Gilmer Mor D.O.   On: 03/08/2021 15:28    Procedures Procedures   Medications Ordered in ED Medications  acetaminophen (TYLENOL) tablet 1,000 mg (1,000 mg Oral Given 03/08/21 1511)    ED Course  I have reviewed the triage vital signs and the nursing notes.  Pertinent labs & imaging results  that were available during my care of the patient were reviewed by me and considered in my medical decision making (see chart for details).    MDM Rules/Calculators/A&P                          Alert 30 year old male no acute stress, nontoxic-appearing.  Patient appears ill.  Presents with flulike illness x2 days.  Patient endorses productive cough.  Will obtain chest x-ray to evaluate for possible bacterial pneumonia.  Patient positive for COVID-19 and influenza.  Due to patient's history of asthma discussed antiviral therapy with paxlovid.  Patient declines antiviral treatment at this time.  Discussed antiviral treatment with Tamiflu for his positive influenza, patient declines this treatment at this time.  Patient noted to  be tachycardic and febrile.  Suspect that tachycardia secondary to patient's fever.  We will give patient Tylenol as he has not had any antipyretics.  Patient able to stand and ambulate, oxygen saturation 96% with ambulation.  Patient reports that he has albuterol inhaler at home.  Will prescribe patient with Tessalon.  Discussed symptomatic treatment and isolation protocols with patient.  Patient to follow-up with his PCP if symptoms do not improve.  Discussed strict return precautions.  Discussed results, findings, treatment and follow up. Patient advised of return precautions. Patient verbalized understanding and agreed with plan.  JAMAN ZERBE was evaluated in Emergency Department on 03/08/2021 for the symptoms described in the history of present illness. He was evaluated in the context of the global COVID-19 pandemic, which necessitated consideration that the patient might be at risk for infection with the SARS-CoV-2 virus that causes COVID-19. Institutional protocols and algorithms that pertain to the evaluation of patients at risk for COVID-19 are in a state of rapid change based on information released by regulatory bodies including the CDC and federal and state  organizations. These policies and algorithms were followed during the patient's care in the ED.      Final Clinical Impression(s) / ED Diagnoses Final diagnoses:  COVID-19  Influenza A    Rx / DC Orders ED Discharge Orders          Ordered    benzonatate (TESSALON) 100 MG capsule  Every 8 hours PRN        03/08/21 1540             Dyann Ruddle 03/08/21 1542    Hayden Rasmussen, MD 03/08/21 1754

## 2021-03-08 NOTE — ED Notes (Addendum)
Reviewed discharge instructions with patient  Pt states understanding.

## 2021-03-16 ENCOUNTER — Other Ambulatory Visit (HOSPITAL_BASED_OUTPATIENT_CLINIC_OR_DEPARTMENT_OTHER): Payer: Self-pay

## 2021-11-23 IMAGING — DX DG TOE GREAT 2+V*L*
3 series · 3 of 3 positions shown · non-contrast
Comparison: None.

CLINICAL DATA: Toe pain

EXAM:
LEFT GREAT TOE

[toe ap]
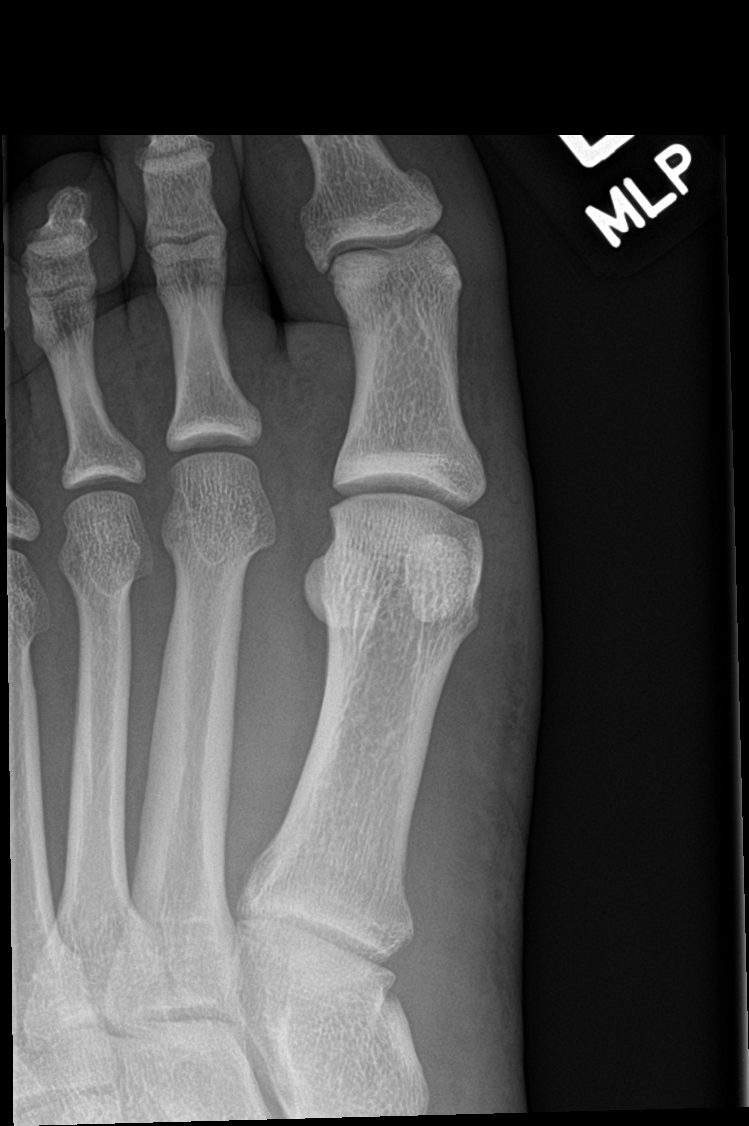

[toe obl]
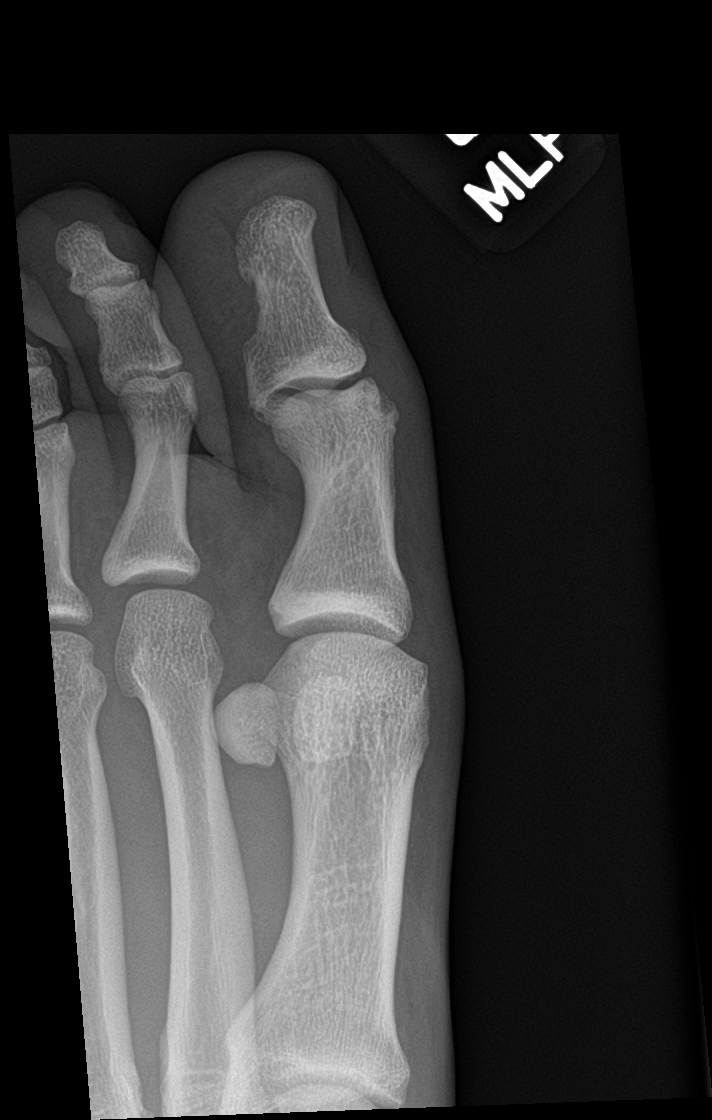

[toe lat]
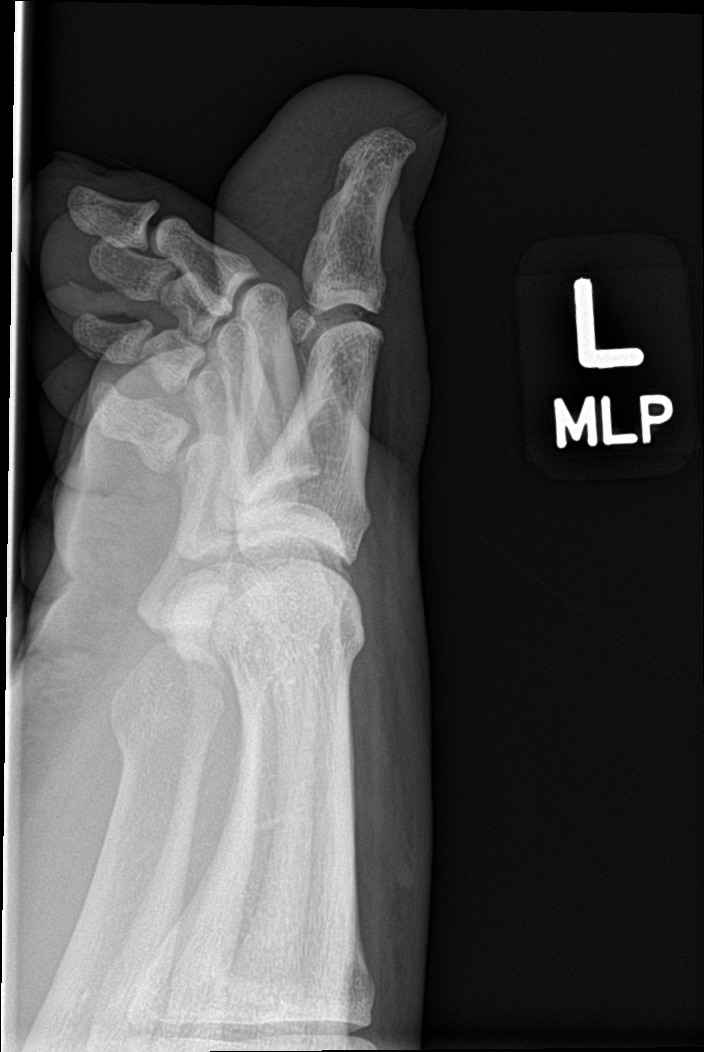

[3 of 3 positions shown; findings below may reference images not displayed]

FINDINGS: There is no evidence of fracture or dislocation. There is no
evidence of arthropathy or other focal bone abnormality. Soft
tissues are unremarkable.
IMPRESSION: Negative.

## 2022-09-19 IMAGING — DX DG CHEST 1V PORT
1 series · 1 of 1 positions shown · non-contrast
Comparison: Portable exam 1159 hours compared to 10/20/2014

CLINICAL DATA: RIGHT-side chest pain since 7477 hours, anxiety,
smoker

EXAM:
PORTABLE CHEST 1 VIEW

[chest ap]
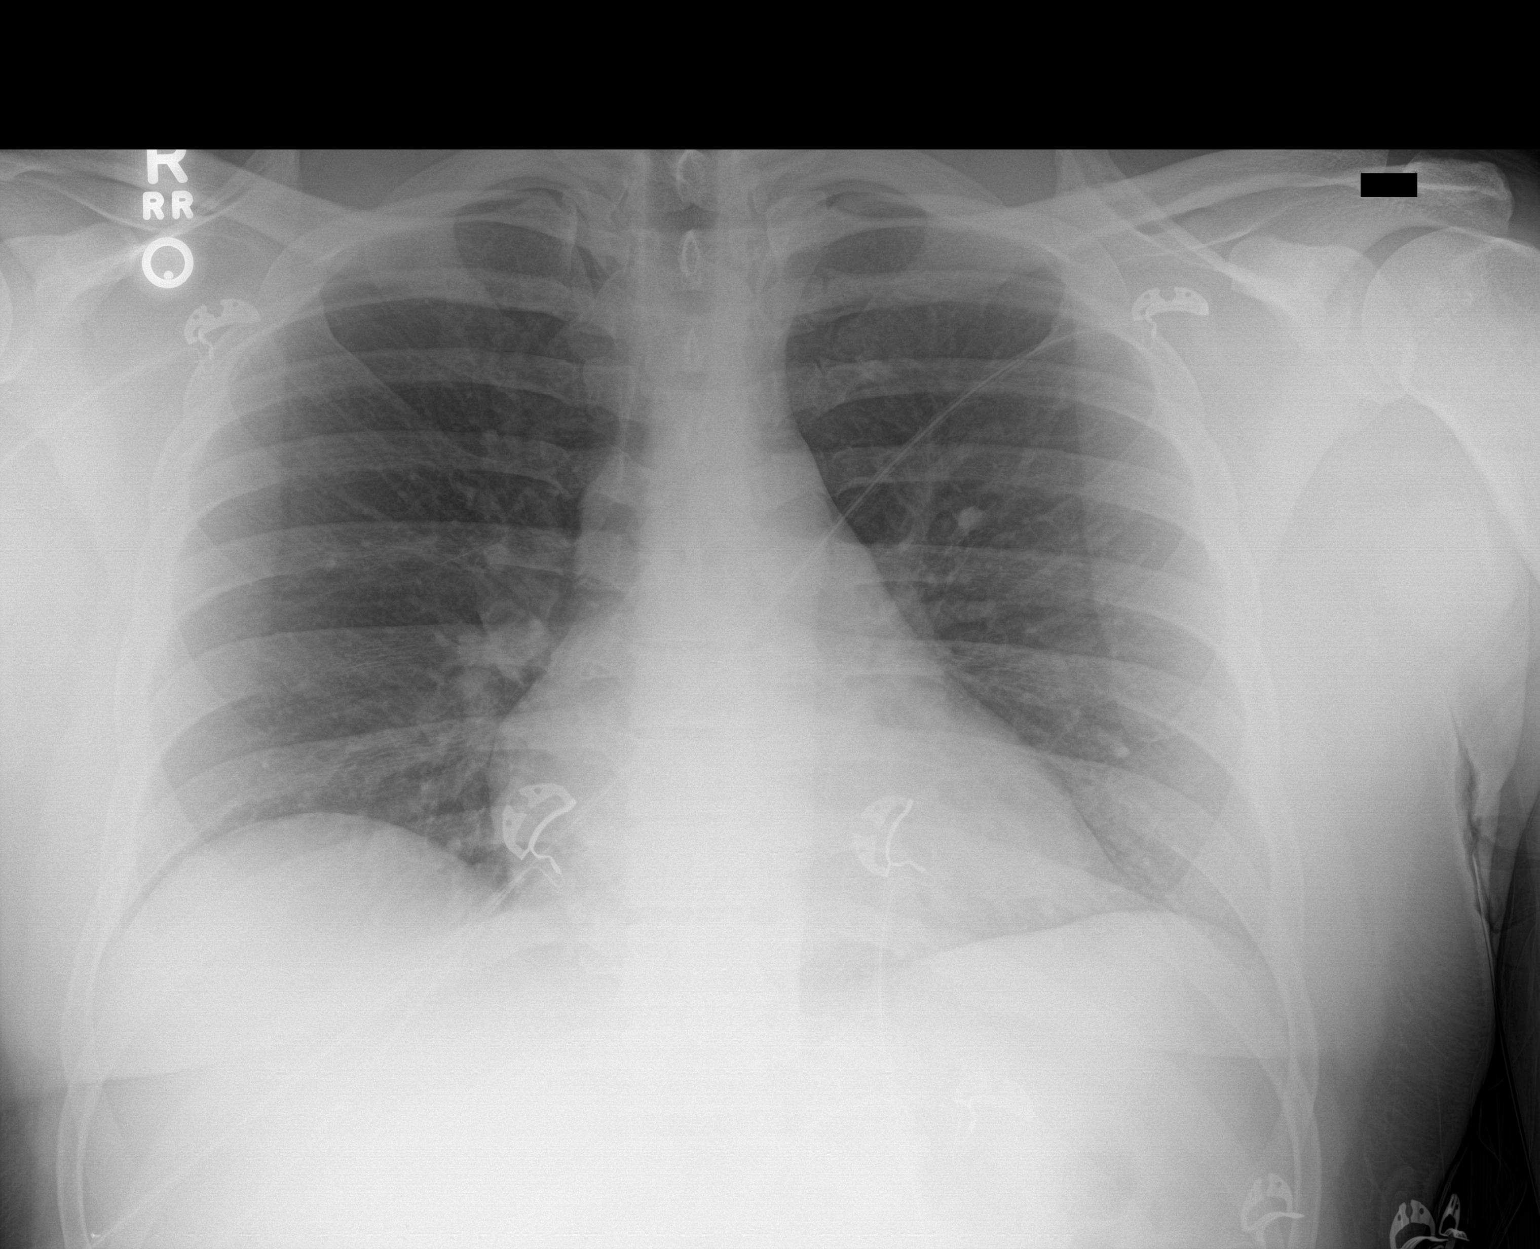

[1 of 1 positions shown; findings below may reference images not displayed]

FINDINGS: Normal heart size, mediastinal contours, and pulmonary vascularity.

Lungs clear.

No infiltrate, pleural effusion, or pneumothorax.

Osseous structures unremarkable.
IMPRESSION: No acute abnormalities.

## 2023-09-04 IMAGING — DX DG CHEST 1V PORT
1 series · 1 of 1 positions shown · non-contrast
Comparison: 03/23/2020

CLINICAL DATA: 30-year-old male with cough

EXAM:
PORTABLE CHEST 1 VIEW

[chest ap]
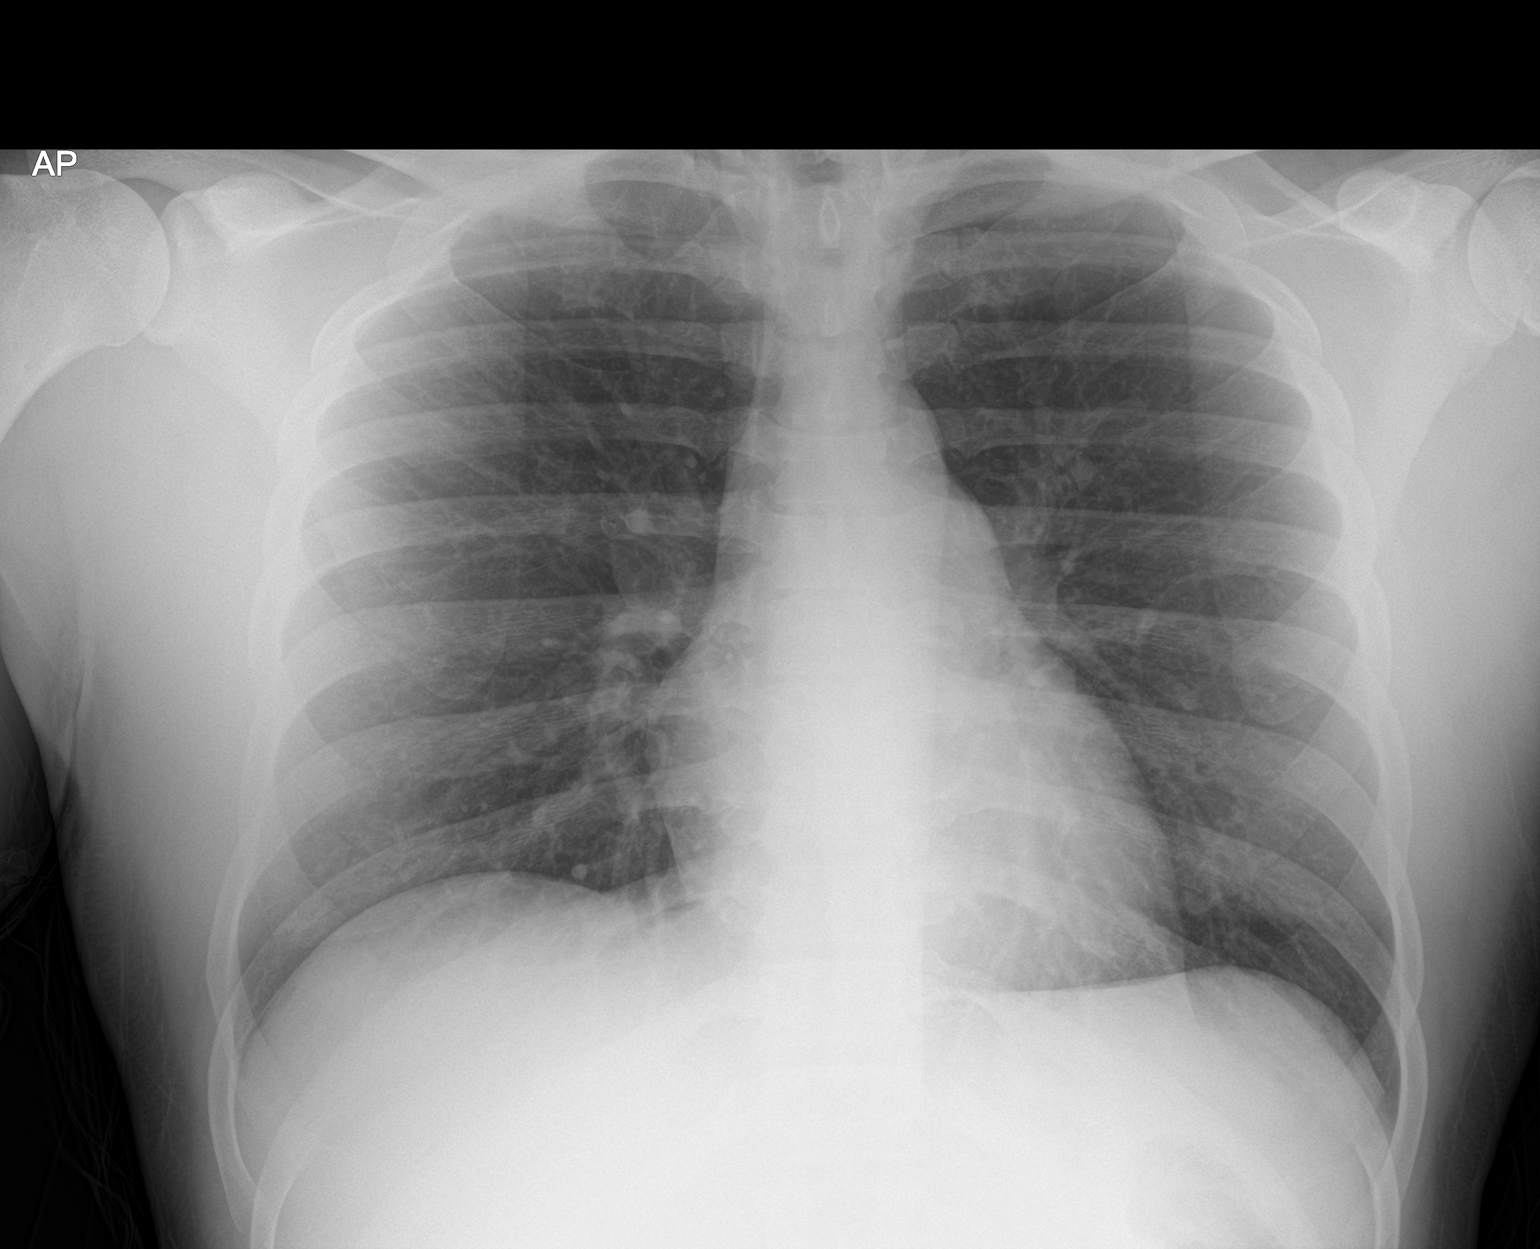

[1 of 1 positions shown; findings below may reference images not displayed]

FINDINGS: Cardiomediastinal silhouette unchanged in size and contour. No
evidence of central vascular congestion. No interlobular septal
thickening. No pneumothorax or pleural effusion. No confluent
airspace disease.

No acute displaced fracture
IMPRESSION: No active disease.
